# Patient Record
Sex: Female | Born: 1965
Health system: Southern US, Community
[De-identification: ages and names within clinical notes are randomized; demographics above are authoritative.]

## PROBLEM LIST (undated history)

## (undated) DIAGNOSIS — N83202 Unspecified ovarian cyst, left side: Secondary | ICD-10-CM

## (undated) DIAGNOSIS — I10 Essential (primary) hypertension: Secondary | ICD-10-CM

## (undated) DIAGNOSIS — D329 Benign neoplasm of meninges, unspecified: Secondary | ICD-10-CM

## (undated) DIAGNOSIS — I8290 Acute embolism and thrombosis of unspecified vein: Secondary | ICD-10-CM

## (undated) DIAGNOSIS — C801 Malignant (primary) neoplasm, unspecified: Secondary | ICD-10-CM

## (undated) DIAGNOSIS — G43909 Migraine, unspecified, not intractable, without status migrainosus: Secondary | ICD-10-CM

## (undated) DIAGNOSIS — N9089 Other specified noninflammatory disorders of vulva and perineum: Secondary | ICD-10-CM

## (undated) DIAGNOSIS — N6009 Solitary cyst of unspecified breast: Secondary | ICD-10-CM

## (undated) DIAGNOSIS — IMO0002 Reserved for concepts with insufficient information to code with codable children: Secondary | ICD-10-CM

## (undated) DIAGNOSIS — R87619 Unspecified abnormal cytological findings in specimens from cervix uteri: Secondary | ICD-10-CM

## (undated) HISTORY — DX: Benign neoplasm of meninges, unspecified: D32.9

## (undated) HISTORY — DX: Acute embolism and thrombosis of unspecified vein: I82.90

## (undated) HISTORY — DX: Malignant (primary) neoplasm, unspecified: C80.1

## (undated) HISTORY — DX: Migraine, unspecified, not intractable, without status migrainosus: G43.909

## (undated) HISTORY — DX: Unspecified abnormal cytological findings in specimens from cervix uteri: R87.619

## (undated) HISTORY — DX: Essential (primary) hypertension: I10

## (undated) HISTORY — DX: Reserved for concepts with insufficient information to code with codable children: IMO0002

## (undated) HISTORY — PX: BASAL CELL CARCINOMA EXCISION: SHX1214

## (undated) HISTORY — DX: Other specified noninflammatory disorders of vulva and perineum: N90.89

## (undated) HISTORY — PX: WISDOM TOOTH EXTRACTION: SHX21

## (undated) HISTORY — DX: Unspecified ovarian cyst, left side: N83.202

## (undated) HISTORY — DX: Solitary cyst of unspecified breast: N60.09

---

## 1991-11-14 HISTORY — PX: CRYOTHERAPY: SHX1416

## 2006-11-13 HISTORY — PX: OTHER SURGICAL HISTORY: SHX169

## 2007-05-01 ENCOUNTER — Other Ambulatory Visit: Admission: RE | Admit: 2007-05-01 | Discharge: 2007-05-01 | Payer: Self-pay | Admitting: Obstetrics & Gynecology

## 2007-05-29 ENCOUNTER — Encounter: Admission: RE | Admit: 2007-05-29 | Discharge: 2007-05-29 | Payer: Self-pay | Admitting: Obstetrics & Gynecology

## 2007-05-31 ENCOUNTER — Encounter: Admission: RE | Admit: 2007-05-31 | Discharge: 2007-05-31 | Payer: Self-pay | Admitting: Obstetrics & Gynecology

## 2007-07-15 HISTORY — PX: NOVASURE ABLATION: SHX5394

## 2007-07-23 ENCOUNTER — Ambulatory Visit (HOSPITAL_BASED_OUTPATIENT_CLINIC_OR_DEPARTMENT_OTHER): Admission: RE | Admit: 2007-07-23 | Discharge: 2007-07-23 | Payer: Self-pay | Admitting: Obstetrics & Gynecology

## 2008-05-13 HISTORY — PX: LIPOMA EXCISION: SHX5283

## 2008-05-20 ENCOUNTER — Other Ambulatory Visit: Admission: RE | Admit: 2008-05-20 | Discharge: 2008-05-20 | Payer: Self-pay | Admitting: Obstetrics & Gynecology

## 2008-06-30 ENCOUNTER — Encounter: Admission: RE | Admit: 2008-06-30 | Discharge: 2008-06-30 | Payer: Self-pay | Admitting: Obstetrics & Gynecology

## 2009-07-01 ENCOUNTER — Encounter: Admission: RE | Admit: 2009-07-01 | Discharge: 2009-07-01 | Payer: Self-pay | Admitting: Obstetrics & Gynecology

## 2010-07-07 ENCOUNTER — Encounter: Admission: RE | Admit: 2010-07-07 | Discharge: 2010-07-07 | Payer: Self-pay | Admitting: Obstetrics & Gynecology

## 2010-12-04 ENCOUNTER — Encounter: Payer: Self-pay | Admitting: Obstetrics & Gynecology

## 2011-03-28 NOTE — Op Note (Signed)
NAME:  Samantha Hoover, Samantha Hoover                ACCOUNT NO.:  [id number]   MEDICAL RECORD NO.:  [id number]          PATIENT TYPE:  AMB   LOCATION:  [facility]                         FACILITY:  [facility]   PHYSICIAN:  M. Leda Quail, MD  DATE OF BIRTH:  [birth year]   DATE OF PROCEDURE:  07/23/2007  DATE OF DISCHARGE:                               OPERATIVE REPORT   PREOPERATIVE DIAGNOSES:  45. A 45 year old G3, P3 married white female with menorrhagia.  2. Undesired fertility.  3. History of migraines.  4. History of superficial left vein clot due to varicosity  5. Mild hypertension.   POSTOPERATIVE DIAGNOSES:  46. A 45 year old G34, P3 married white female with menorrhagia.  2. Undesired fertility.  3. History of migraines.  4. History of superficial left vein clot due to varicosity  5. Mild hypertension.   PROCEDURE:  1. NovaSure ablation.  2. Hysteroscopy.  3. Laparoscopic bilateral tubal ligation with Filshie clips.   SURGEON:  M. Leda Quail, MD   ASSISTANT:  OR staff.   ANESTHESIA:  General endotracheal.   FINDINGS:  Intraperitoneally she had a normal-appearing uterus, tubes,  and ovaries.  She had a normal looking bladder, a normal liver edge.  The stomach edge could not be seen do to omentum.  The gallbladder could  not be seen; and the appendix was not visualized.  On hysteroscopy she  did have a normal cavity except that the right side of the uterus had a  bit of an arcuate pattern.   SPECIMENS:  None.   ESTIMATED BLOOD LOSS:  Minimal.   FLUIDS:  1200 mL of. LR.   URINE OUTPUT:  75 mL of clear urine drained with a red rubber Foley  catheter at the beginning of the procedure.   FLUID DEFICIT:  60 mL of the hysteroscopic fluid media.   DESCRIPTION OF PROCEDURE:  Patient taken to operation room.  She was  placed in the supine position.  Anesthesia was administered by the  anesthesia staff without of difficulty.  Legs were then positioned in  [place] stirrups in the low  lithotomy position.  Abdomen, perineum, inner  thighs, and vagina prepped and draped in a normal sterile fashion.  A  red rubber Foley catheter was used to drain the bladder of all urine.  A  speculum was placed in the vagina.  Cervix was well visualized; and the  anterior lip of the cervix was grasped with a single-tooth tenaculum.  An Acorn uterine manipulator was passed through the cervical os and  attached to the tenaculum as a means of manipulating the uterus during  the procedure.   Attention was turned to the abdomen, 7 mL of 1/4% Marcaine were  instilled beneath the umbilicus.  A 10-mm skin incision was made with a  #11 blade.  The subcutaneous fat tissue was dissected with a hemostat.  The sacral promontory was palpated.  The abdominal wall was elevated and  a Veress needle was inserted through the infraumbilical incision aiming  toward the pelvis below the level of the sacral promontory.  Once the  peritoneum was popped through;  a syringe of normal saline was obtained.  An aspiration was performed with no fluid or blood noted.  Fluid injects  easily; and then an aspiration was performed, again, without any blood  fluid or normal saline coming back in the [place].  Then a drip test  dripped easily into the abdomen.  CO2 gas was attached to the Veress  needle under low flow, and a pneumoperitoneum was achieved easily.  Pressures were never higher than 8 mmHg and 3 liters of gas was  instilled into the abdomen under low flow.   At this point, a Veress needle was removed.  A 10-mm nonbladed OptiVu  trocar and port were obtained.  The straight scope was passed through  this.  This was aimed towards the pelvis below the level of the sacral  promontory; and using a twisting motion the abdominal wall layers were  well visualized as they were traversed.  The trocar was removed and the  laparoscope was used to survey the pelvis.  The patient was then placed  in some Trendelenburg.  An  operative scope with a Filshie clip, and a  clip applier was obtained.  The tubes on each side are identified and  followed out to the fimbriated end.  Then a Filshie clip was placed on  the isthmic region of both tubes bilaterally; and without difficulty  photo documentation was made.   At this point the patient was placed out of Trendelenburg and the scope  was removed.  The pneumoperitoneum was easily relieved.  Anesthesiologist gave the patient several deep breaths while palpating  the abdomen to ensure that all the gas was out of the abdomen.  Then the  midline port was completely removed.  An operating finger was placed in  the port and no bowel was within the incision.  The fascia was closed  with a figure-of-eight suture of #2-0 Vicryl on a UR needle; and the  skin was closed with a subcuticular stitch of 3-0 Vicryl and Benzoin was  applied to the skin.   Attention was turned to the vagina.  Legs were positioned in the high  lithotomy position.  A bivalve speculum was placed in the vagina, and an  Acorn uterine manipulator was removed from the cervix.  The uterus was  then sounded to 9 cm.  The endocervical canal was measured at  approximately 5 cm with a cavity length of 4.5 cm.  The cervix was  dilated up using Hegar dilators to a #8.  The hysteroscope was passed  through the cervical canal and using a normal saline as hysteroscope  media, the cavity is dilated open with the fluid.  Tubal ostia are noted  bilaterally in the cavity is noted; and photo documentation was made.   At this point the hysteroscope was removed.  The NovaSure apparatus is  obtained.  This opened outside the patient to ensure that the array was  working correctly.  The cavity length is set.  The vacuum valve is  tapped; then the device was placed in the patient and opened.  The  device is seated by moving it up-and-down, twisting it to the right-and-  the-left, moving it back-and-forth, and this was  done twice.  The cavity  width was 2.9.  This was done a second time to ensure that there were no  issues of the array opening correctly.  We got the same cavity width;  and, therefore, I decided to proceed with the cavity assessment test.  The cervical cap was placed over the cervix; and the cavity assessment  test was performed which passed easily.  Then the ablation was  performed.  The power was at 72 and a 120-second cycle was performed.  After the cycle was complete, the array was closed, and the device was  brought outside of the patient.   Using a hysteroscope the cavity was visualized again.  There was  excellent ablation except for a small area, right at the area of the  cornu of the uterus on the right side; and this area actually appeared  to have an arcuate pattern to it unlike the opposite side.  There was a  very small area that did not get completely ablated.  At this point, the  procedure was ended.  All instruments were removed.  The tenaculum was  removed from the cervix.  Of note, a cervical block was placed as the  procedure started vaginally.  This block was done with 1% of lidocaine;  and 10 mL were instilled circumferentially on the cervix at the 3, 6, 9,  and 12 o'clock positions with 2.5 mL being instilled in each location.   Sponge, lap, needle, and instrument counts were correct x2.  The patient  tolerated the procedure very well; and she was awakened from anesthesia  and taken to the recovery room in stable condition.      Lum Keas, MD  Electronically Signed     MSM/MEDQ  D:  07/23/2007  T:  07/23/2007  Job:  [id number]

## 2011-06-06 ENCOUNTER — Other Ambulatory Visit: Payer: Self-pay | Admitting: Obstetrics & Gynecology

## 2011-06-06 DIAGNOSIS — Z1231 Encounter for screening mammogram for malignant neoplasm of breast: Secondary | ICD-10-CM

## 2011-07-11 ENCOUNTER — Ambulatory Visit
Admission: RE | Admit: 2011-07-11 | Discharge: 2011-07-11 | Disposition: A | Discharge status: Home / Self Care | Payer: BC Managed Care – PPO | Source: Ambulatory Visit | Attending: Obstetrics & Gynecology | Admitting: Obstetrics & Gynecology

## 2011-07-11 DIAGNOSIS — Z1231 Encounter for screening mammogram for malignant neoplasm of breast: Secondary | ICD-10-CM

## 2011-08-25 LAB — URINALYSIS, ROUTINE W REFLEX MICROSCOPIC
Glucose, UA: NEGATIVE
Hgb urine dipstick: NEGATIVE
Ketones, ur: NEGATIVE
Urobilinogen, UA: 0.2

## 2011-08-25 LAB — CBC
HCT: 36.7
Hemoglobin: 12.5
MCHC: 34
RDW: 13.9

## 2011-08-25 LAB — PREGNANCY, URINE: Preg Test, Ur: NEGATIVE

## 2012-08-02 ENCOUNTER — Other Ambulatory Visit (HOSPITAL_BASED_OUTPATIENT_CLINIC_OR_DEPARTMENT_OTHER): Payer: Self-pay | Admitting: *Deleted

## 2012-08-02 DIAGNOSIS — Z139 Encounter for screening, unspecified: Secondary | ICD-10-CM

## 2012-08-05 ENCOUNTER — Ambulatory Visit (HOSPITAL_BASED_OUTPATIENT_CLINIC_OR_DEPARTMENT_OTHER)
Admission: RE | Admit: 2012-08-05 | Discharge: 2012-08-05 | Disposition: A | Discharge status: Home / Self Care | Payer: Commercial Managed Care - PPO | Source: Ambulatory Visit | Attending: Obstetrics & Gynecology | Admitting: Obstetrics & Gynecology

## 2012-08-05 DIAGNOSIS — Z1231 Encounter for screening mammogram for malignant neoplasm of breast: Secondary | ICD-10-CM | POA: Insufficient documentation

## 2012-08-05 DIAGNOSIS — Z139 Encounter for screening, unspecified: Secondary | ICD-10-CM

## 2013-05-12 ENCOUNTER — Telehealth: Payer: Self-pay | Admitting: Obstetrics & Gynecology

## 2013-05-12 ENCOUNTER — Encounter: Payer: Self-pay | Admitting: Obstetrics & Gynecology

## 2013-05-12 NOTE — Telephone Encounter (Signed)
Patient has appointment on June 11, 2013 1:45. This is the earliest that I could get her in. She is having problems with ovarian pain in both sides. Mother has had Pre cancers cells in the past which end up as a hysterectomy.I am putting patient on waiting list . But, she would like to see if she can get in sooner.

## 2013-05-12 NOTE — Telephone Encounter (Signed)
Spoke with pt about appt tomorrow at 12:45. Pt able to come. resched OV for 05-13-13 at 12:45 with SM.

## 2013-05-13 ENCOUNTER — Encounter: Payer: Self-pay | Admitting: Obstetrics & Gynecology

## 2013-05-13 ENCOUNTER — Ambulatory Visit (INDEPENDENT_AMBULATORY_CARE_PROVIDER_SITE_OTHER): Payer: Commercial Managed Care - PPO | Admitting: Obstetrics & Gynecology

## 2013-05-13 ENCOUNTER — Other Ambulatory Visit: Payer: Self-pay | Admitting: *Deleted

## 2013-05-13 VITALS — BP 118/78 | HR 60 | Resp 16 | Ht 67.0 in | Wt 154.4 lb

## 2013-05-13 DIAGNOSIS — N9489 Other specified conditions associated with female genital organs and menstrual cycle: Secondary | ICD-10-CM

## 2013-05-13 NOTE — Patient Instructions (Signed)
Please call if anything changes before your follow-up appointment

## 2013-05-13 NOTE — Progress Notes (Signed)
Subjective:     Patient ID: Samantha Hoover, female   DOB: 1966/07/13, 47 y.o.   MRN: [id number]  HPI 47 year old here for bilateral pelvic pain/cramping that has been present almost constantly for the last several months.  She reports this feels just like "ovulation pain" but it's all the time.  No fevers.  Bowel movements are regular.  Very rarely does she have constipation.  She has no urinary issues.  Her mother had a hysterectomy around age 61.    Has lost 25 pounds this past year through weight watchers at work.  She has been stable stable for several weeks now.    Review of Systems  All other systems reviewed and are negative.       Objective:   Physical Exam  Constitutional: She is oriented to person, place, and time. She appears well-developed and well-nourished.  Neck: Normal range of motion. Neck supple.  Abdominal: Soft. Bowel sounds are normal. She exhibits no distension. There is no tenderness. There is no rebound and no guarding.  Genitourinary: There is no rash or tenderness on the right labia. There is no rash or tenderness on the left labia. Uterus is not deviated (retroflexed), not fixed and not tender. Cervix exhibits no motion tenderness, no discharge and no friability. Right adnexum displays no mass, no tenderness and no fullness. Left adnexum displays no mass, no tenderness and no fullness. No erythema, tenderness or bleeding around the vagina. No foreign body around the vagina. No signs of injury around the vagina.  Musculoskeletal: Normal range of motion.  Neurological: She is alert and oriented to person, place, and time.  Skin: Skin is warm and dry.  Psychiatric: She has a normal mood and affect.       Assessment:     Chronic pelvic pain/cramping, bilateral "ovarian pain" H/O novasure ablation     Plan:     PUS to assess ovaries If normal, pt contemplating LAVH/poss BSO.  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

## 2013-05-21 NOTE — Telephone Encounter (Signed)
LMTCB to discuss insurance information for upcoming PUS. Patient owes $313.30/CME

## 2013-06-02 ENCOUNTER — Other Ambulatory Visit: Payer: Self-pay | Admitting: Obstetrics & Gynecology

## 2013-06-02 ENCOUNTER — Ambulatory Visit (INDEPENDENT_AMBULATORY_CARE_PROVIDER_SITE_OTHER): Payer: Commercial Managed Care - PPO | Admitting: Obstetrics & Gynecology

## 2013-06-02 ENCOUNTER — Encounter: Payer: Self-pay | Admitting: Obstetrics & Gynecology

## 2013-06-02 ENCOUNTER — Ambulatory Visit (INDEPENDENT_AMBULATORY_CARE_PROVIDER_SITE_OTHER): Payer: Commercial Managed Care - PPO

## 2013-06-02 DIAGNOSIS — N9489 Other specified conditions associated with female genital organs and menstrual cycle: Secondary | ICD-10-CM

## 2013-06-02 DIAGNOSIS — Q519 Congenital malformation of uterus and cervix, unspecified: Secondary | ICD-10-CM

## 2013-06-02 DIAGNOSIS — N8300 Follicular cyst of ovary, unspecified side: Secondary | ICD-10-CM

## 2013-06-02 DIAGNOSIS — N949 Unspecified condition associated with female genital organs and menstrual cycle: Secondary | ICD-10-CM

## 2013-06-02 DIAGNOSIS — R102 Pelvic and perineal pain: Secondary | ICD-10-CM

## 2013-06-02 NOTE — Patient Instructions (Signed)
Plan repeat ultrasound three months.

## 2013-06-02 NOTE — Progress Notes (Signed)
47 y.o.Marriedfemale here for a pelvic ultrasound.  Patient has been experiencing pelvic cramping/pain for months.  This has been getting gradually worse.  She did undergo an ablation in 9/08.  The pain she feels is like menstrual cramping but it seems to be present all of the time.  She does report in the last three to four weeks that she is experiencing more LLQ pain as well.  She does cycle but the bleeding is still much lighter than before her ablation  Patient's last menstrual period was 05/19/2013.  Sexually active:  yes  Contraception: bilateral tubal ligation  FINDINGS: UTERUS: 9.5 x 5.4 x 4.4cm without fibroids, septum noted (images from 2007 reviewed at well as operative report.  Mild arcuate uterine cavity noted at time of hysteroscopy/ablation.) EMS: 5.70mm ADNEXA:   Left ovary 4.4 x 4.1 x 3.3cm with three smooth walled cysts <2cm   Right ovary 2.8 x 1.8 x 1.2cm CUL DE SAC: no free fluid  Discussed with patient findings.  Images reviewed with patient.  I feel she has post-ablation pain due to the nature of her pain and that the left ovarian cysts are functional.  I feel they will resolve with time and have recommended follow-up.  She is in agreement with this.  She is contemplating a hysterectomy if pain continues.  She cannot do this until the end of the year.  Timing discussed with patient.  She wants to check schedule.  Procedure, risks, recovery all discussed with patient.    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  Patient considering hysterectomy and will call later in the year or discuss at follow-up.  ~25 minutes spent with patient >50% of time was in face to face discussion of above.

## 2013-06-11 ENCOUNTER — Ambulatory Visit: Payer: Self-pay | Admitting: Obstetrics & Gynecology

## 2013-07-17 ENCOUNTER — Other Ambulatory Visit: Payer: Self-pay | Admitting: Obstetrics & Gynecology

## 2013-07-17 DIAGNOSIS — Z1231 Encounter for screening mammogram for malignant neoplasm of breast: Secondary | ICD-10-CM

## 2013-08-05 ENCOUNTER — Telehealth: Payer: Self-pay | Admitting: Obstetrics & Gynecology

## 2013-08-05 NOTE — Telephone Encounter (Signed)
LMTCB to reschedule PUS. Cx current appt.

## 2013-08-06 ENCOUNTER — Ambulatory Visit (HOSPITAL_BASED_OUTPATIENT_CLINIC_OR_DEPARTMENT_OTHER): Payer: BC Managed Care – PPO

## 2013-08-07 NOTE — Telephone Encounter (Signed)
Patient Called back during lunch to reschedule PUS

## 2013-08-11 ENCOUNTER — Ambulatory Visit (HOSPITAL_BASED_OUTPATIENT_CLINIC_OR_DEPARTMENT_OTHER)
Admission: RE | Admit: 2013-08-11 | Discharge: 2013-08-11 | Disposition: A | Discharge status: Home / Self Care | Payer: Commercial Managed Care - PPO | Source: Ambulatory Visit | Attending: Obstetrics & Gynecology | Admitting: Obstetrics & Gynecology

## 2013-08-11 DIAGNOSIS — Z1231 Encounter for screening mammogram for malignant neoplasm of breast: Secondary | ICD-10-CM | POA: Insufficient documentation

## 2013-08-11 NOTE — Telephone Encounter (Signed)
Pt calling back to schedule ultrasound.

## 2013-08-11 NOTE — Telephone Encounter (Signed)
LMTCB to reschedule PUS.

## 2013-09-02 ENCOUNTER — Other Ambulatory Visit: Payer: Commercial Managed Care - PPO | Admitting: Obstetrics & Gynecology

## 2013-09-02 ENCOUNTER — Other Ambulatory Visit: Payer: Commercial Managed Care - PPO

## 2013-09-04 ENCOUNTER — Ambulatory Visit (INDEPENDENT_AMBULATORY_CARE_PROVIDER_SITE_OTHER): Payer: Commercial Managed Care - PPO

## 2013-09-04 ENCOUNTER — Ambulatory Visit (INDEPENDENT_AMBULATORY_CARE_PROVIDER_SITE_OTHER): Payer: Commercial Managed Care - PPO | Admitting: Obstetrics & Gynecology

## 2013-09-04 VITALS — BP 102/64 | Ht 67.0 in | Wt 159.0 lb

## 2013-09-04 DIAGNOSIS — N8300 Follicular cyst of ovary, unspecified side: Secondary | ICD-10-CM

## 2013-09-04 DIAGNOSIS — N83209 Unspecified ovarian cyst, unspecified side: Secondary | ICD-10-CM

## 2013-09-04 NOTE — Progress Notes (Signed)
47 y.o.Marriedfemale here for a pelvic ultrasound.  H/O enlarged left ovary with multiple cysts found with PUS after patient was being evaluated for pelvic cramping.  Pain has resolved.  No new issues.  Bowel and bladder function is normal.  No LMP recorded. H/O ablation.  Sexually active:  yes  Contraception: bilateral tubal ligation  FINDINGS: UTERUS: 9.3 x 5.9 x 4.4cm with septum EMS: 4.4 - 8.6cm ADNEXA:   Left ovary 3.0 x 1.5 x 1.7 cm with 9mm follicle.  Prior cysts resolved.   Right ovary 3.8 x 2.4 x 2.0cm with 15mm avascular cyst. CUL DE SAC: 15 x 25mm free fluid collection  Images reviewed with patient.  Cyst resolution noted on left ovary.  Small septum discussed.  Pt reports father had 2 ureters on one side.  She has never told me this in the past.  Aware I will discuss with Dr. Sherron Monday to see if any testing or evaluation is needed.  All questions answered.    Assessment:  Left ovarian cysts, resolved Small uterine septum Father with duplicated ureter, unilateral  Plan: F/U prn or for AEX. May need urology consultation.  Will check with Dr. Ranae Plumber.  ~15 minutes spent with patient >50% of time was in face to face discussion of above.

## 2013-09-11 ENCOUNTER — Encounter: Payer: Self-pay | Admitting: Obstetrics & Gynecology

## 2013-09-11 NOTE — Patient Instructions (Signed)
Please call with any new problems/issues. 

## 2013-09-18 ENCOUNTER — Ambulatory Visit (INDEPENDENT_AMBULATORY_CARE_PROVIDER_SITE_OTHER): Payer: Commercial Managed Care - PPO | Admitting: Obstetrics & Gynecology

## 2013-09-18 ENCOUNTER — Encounter: Payer: Self-pay | Admitting: Obstetrics & Gynecology

## 2013-09-18 VITALS — BP 110/70 | HR 74 | Resp 16 | Wt 157.0 lb

## 2013-09-18 DIAGNOSIS — Z0000 Encounter for general adult medical examination without abnormal findings: Secondary | ICD-10-CM

## 2013-09-18 DIAGNOSIS — Z01419 Encounter for gynecological examination (general) (routine) without abnormal findings: Secondary | ICD-10-CM

## 2013-09-18 LAB — HEMOGLOBIN, FINGERSTICK: Hemoglobin, fingerstick: 13.7 g/dL (ref 12.0–16.0)

## 2013-09-18 LAB — POCT URINALYSIS DIPSTICK
Blood, UA: NEGATIVE
Protein, UA: NEGATIVE
Urobilinogen, UA: NEGATIVE

## 2013-09-18 MED ORDER — NAPROXEN 500 MG PO TABS: 500.0000 mg | ORAL_TABLET | Freq: Two times a day (BID) | ORAL | Status: DC

## 2013-09-18 MED ORDER — RIZATRIPTAN BENZOATE 10 MG PO TABS: 10.0000 mg | ORAL_TABLET | ORAL | Status: DC | PRN

## 2013-09-18 MED ORDER — CLOBETASOL PROPIONATE 0.05 % EX OINT: TOPICAL_OINTMENT | CUTANEOUS | Status: DC | PRN

## 2013-09-18 NOTE — Progress Notes (Signed)
47 y.o. G3P3 MarriedCaucasianF here for annual exam.  Reports having blood work through work.  Was normal.  This is a work benefit at her current job.  Cycles still regular.  Patient's last menstrual period was 09/11/2013.          Sexually active: yes  The current method of family planning is tubal ligation.    Exercising: yes   walking Smoker: No  Health Maintenance: Pap: 08/08/12 neg History of abnormal Pap:  Yes   1993 Colpo/BX  And Cryo was done MMG: 08/2013 normal Colonoscopy: never BMD:   2008 normal TDaP   05/31/10 Screening Labs: here 2012, Hb today: 13.7, Urine today: neg   reports that she has never smoked. She has never used smokeless tobacco. She reports that she drinks about 0.5 ounces of alcohol per week. She reports that she does not use illicit drugs.  Past Medical History  Diagnosis Date  . Blood clot in vein     superficial vein left leg clot secondary BCP  . Hypertension   . Migraines   . Abnormal Pap smear     remote h/o  . Fissure of vulva     fissure/tear along inferior left labia majora  . Cyst (solitary) of breast   . Ovarian cyst, left 05/2013    Past Surgical History  Procedure Laterality Date  . Cesarean section    . Wisdom tooth extraction    . Laser of veins  1/08     left leg  . Cryotherapy  1993    to cervix  . Novasure ablation  9/08    and BTL  . Lipoma excision  7/09    Current Outpatient Prescriptions  Medication Sig Dispense Refill  . clobetasol ointment (TEMOVATE) 0.05 % Apply topically as needed.      . Multiple Vitamins-Minerals (MULTIVITAMIN PO) Take by mouth daily.      . naproxen (NAPROSYN) 500 MG tablet Take 500 mg by mouth. 3 4 x daily as needed      . Rizatriptan Benzoate (MAXALT PO) Take 10 mg by mouth as needed.       Marland Kitchen MELATONIN PO Take by mouth. Occasionally       No current facility-administered medications for this visit.    Family History  Problem Relation Age of Onset  . Cancer Mother     thyroid  .  Hypertension Mother   . Osteoporosis Mother   . Cancer Father     bladder  . Heart attack Maternal Grandfather   . Heart attack Paternal Grandmother     ROS:  Pertinent items are noted in HPI.  Otherwise, a comprehensive ROS was negative.  Exam:   BP 110/70  Pulse 74  Resp 16  Wt 157 lb (71.215 kg)  LMP 09/11/2013  Wt: -14 lbs from 9/13  General appearance: alert, cooperative and appears stated age Head: Normocephalic, without obvious abnormality, atraumatic Neck: no adenopathy, supple, symmetrical, trachea midline and thyroid normal to inspection and palpation Lungs: clear to auscultation bilaterally Breasts: normal appearance, no masses or tenderness Heart: regular rate and rhythm Abdomen: soft, non-tender; bowel sounds normal; no masses,  no organomegaly Extremities: extremities normal, atraumatic, no cyanosis or edema Skin: Skin color, texture, turgor normal. No rashes or lesions Lymph nodes: Cervical, supraclavicular, and axillary nodes normal. No abnormal inguinal nodes palpated Neurologic: Grossly normal   Pelvic: External genitalia:  no lesions              Urethra:  normal  appearing urethra with no masses, tenderness or lesions              Bartholins and Skenes: normal                 Vagina: normal appearing vagina with normal color and discharge, no lesions              Cervix: no lesions              Pap taken: no Bimanual Exam:  Uterus:  normal size, contour, position, consistency, mobility, non-tender              Adnexa: normal adnexa and no mass, fullness, tenderness               Rectovaginal: Confirms               Anus:  normal sphincter tone, no lesions  A:  Well Woman with normal exam H/O novasure ablation and BTL 9/08 Dysmenorrhea since ablation H/O superficial vein occlusion on COPs Migraines 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  Spoke with Dr. Sherron Monday about whether extra imaging needed.  Pt knows unless having problems/gyn  or gu surgery, no additional testing needed.  If ever needs testing, CT with contrast is recommended.  Pt clearly aware.  P:   Mammogram scheduled pap smear with neg HR HPV 9/13.  No Pap today. Clobetasol 0.05% ointment BID prn hemorrhoid pain.  #30 gm/RF to pharmacy Maxalt 10mg  at headache onset.  Repet 2 hours.  #9/5 RF to pharmacy. Naprosyn 500mg  BID prn.  #60 with RF to pharmacy. Pt will bring labs from work next year. return annually or prn  An After Visit Summary was printed and given to the patient.

## 2014-07-06 ENCOUNTER — Other Ambulatory Visit: Payer: Self-pay | Admitting: Obstetrics & Gynecology

## 2014-07-06 DIAGNOSIS — Z1231 Encounter for screening mammogram for malignant neoplasm of breast: Secondary | ICD-10-CM

## 2014-08-13 HISTORY — PX: MOLE REMOVAL: SHX2046

## 2014-08-17 ENCOUNTER — Ambulatory Visit (HOSPITAL_BASED_OUTPATIENT_CLINIC_OR_DEPARTMENT_OTHER)
Admission: RE | Admit: 2014-08-17 | Discharge: 2014-08-17 | Disposition: A | Discharge status: Home / Self Care | Payer: BC Managed Care – PPO | Source: Ambulatory Visit | Attending: Obstetrics & Gynecology | Admitting: Obstetrics & Gynecology

## 2014-08-17 DIAGNOSIS — Z1231 Encounter for screening mammogram for malignant neoplasm of breast: Secondary | ICD-10-CM

## 2014-09-14 ENCOUNTER — Encounter: Payer: Self-pay | Admitting: Obstetrics & Gynecology

## 2014-10-22 ENCOUNTER — Encounter: Payer: Self-pay | Admitting: Obstetrics & Gynecology

## 2014-10-22 ENCOUNTER — Ambulatory Visit (INDEPENDENT_AMBULATORY_CARE_PROVIDER_SITE_OTHER): Payer: BLUE CROSS/BLUE SHIELD | Admitting: Obstetrics & Gynecology

## 2014-10-22 ENCOUNTER — Telehealth: Payer: Self-pay

## 2014-10-22 VITALS — BP 120/80 | HR 80 | Resp 16 | Ht 66.75 in | Wt 167.0 lb

## 2014-10-22 DIAGNOSIS — N9089 Other specified noninflammatory disorders of vulva and perineum: Secondary | ICD-10-CM | POA: Diagnosis not present

## 2014-10-22 DIAGNOSIS — Z01419 Encounter for gynecological examination (general) (routine) without abnormal findings: Secondary | ICD-10-CM

## 2014-10-22 DIAGNOSIS — Z0000 Encounter for general adult medical examination without abnormal findings: Secondary | ICD-10-CM

## 2014-10-22 DIAGNOSIS — L292 Pruritus vulvae: Secondary | ICD-10-CM | POA: Diagnosis not present

## 2014-10-22 DIAGNOSIS — Z124 Encounter for screening for malignant neoplasm of cervix: Secondary | ICD-10-CM | POA: Diagnosis not present

## 2014-10-22 LAB — POCT URINALYSIS DIPSTICK
Bilirubin, UA: NEGATIVE
Glucose, UA: NEGATIVE
KETONES UA: NEGATIVE
LEUKOCYTES UA: NEGATIVE
Nitrite, UA: NEGATIVE
Protein, UA: NEGATIVE
RBC UA: NEGATIVE
UROBILINOGEN UA: NEGATIVE
pH, UA: 7

## 2014-10-22 MED ORDER — RIZATRIPTAN BENZOATE 10 MG PO TABS: 10.0000 mg | ORAL_TABLET | ORAL | Status: DC | PRN

## 2014-10-22 MED ORDER — NAPROXEN 500 MG PO TABS: 500.0000 mg | ORAL_TABLET | Freq: Two times a day (BID) | ORAL | Status: DC

## 2014-10-22 MED ORDER — CLOBETASOL PROPIONATE 0.05 % EX OINT: TOPICAL_OINTMENT | CUTANEOUS | Status: DC | PRN
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
## 2014-10-22 NOTE — Telephone Encounter (Signed)
Received fax from [facility] asking the frequency of how the patient will be using the rx and days supply limitation. (Naproxen). Per Dr. Sabra Heck, the directions is Take 1 tablet by mouth twice daily with a meal. Called pharmacy and stated the directions.  Encounter closed

## 2014-10-22 NOTE — Patient Instructions (Signed)

## 2014-10-24 LAB — IPS PAP TEST WITH REFLEX TO HPV

## 2014-11-03 ENCOUNTER — Telehealth: Payer: Self-pay

## 2014-11-03 NOTE — Telephone Encounter (Signed)
-----   Message from Lyman Speller, MD sent at 11/02/2014  1:08 PM EST ----- Inform pt biopsy showed just a chronic skin irritation.  Ok to keep using the steroid as needed.  Does she need RX?

## 2014-11-03 NOTE — Telephone Encounter (Signed)
Patient notified of results. Does not need rx at this time, will call back PRN.//kn

## 2014-11-03 NOTE — Telephone Encounter (Signed)
Lmtcb//kn 

## 2014-11-12 ENCOUNTER — Other Ambulatory Visit: Payer: BC Managed Care – PPO

## 2014-11-19 ENCOUNTER — Other Ambulatory Visit (INDEPENDENT_AMBULATORY_CARE_PROVIDER_SITE_OTHER): Payer: BLUE CROSS/BLUE SHIELD

## 2014-11-19 DIAGNOSIS — Z0000 Encounter for general adult medical examination without abnormal findings: Secondary | ICD-10-CM

## 2014-11-19 LAB — COMPREHENSIVE METABOLIC PANEL
ALT: 12 U/L (ref 0–35)
AST: 17 U/L (ref 0–37)
Albumin: 4.6 g/dL (ref 3.5–5.2)
Alkaline Phosphatase: 52 U/L (ref 39–117)
BUN: 10 mg/dL (ref 6–23)
CALCIUM: 9.6 mg/dL (ref 8.4–10.5)
CO2: 28 mEq/L (ref 19–32)
CREATININE: 0.84 mg/dL (ref 0.50–1.10)
Chloride: 98 mEq/L (ref 96–112)
Glucose, Bld: 95 mg/dL (ref 70–99)
Potassium: 4.2 mEq/L (ref 3.5–5.3)
SODIUM: 136 meq/L (ref 135–145)
Total Bilirubin: 0.9 mg/dL (ref 0.2–1.2)
Total Protein: 7.1 g/dL (ref 6.0–8.3)

## 2014-11-19 LAB — LIPID PANEL
CHOLESTEROL: 209 mg/dL — AB (ref 0–200)
HDL: 79 mg/dL (ref >39)
LDL Cholesterol: 116 mg/dL — ABNORMAL HIGH (ref 0–99)
Total CHOL/HDL Ratio: 2.6 Ratio
Triglycerides: 68 mg/dL (ref <150)
VLDL: 14 mg/dL (ref 0–40)

## 2014-11-19 LAB — CBC
HCT: 41.5 % (ref 36.0–46.0)
HEMOGLOBIN: 14.1 g/dL (ref 12.0–15.0)
MCH: 30.1 pg (ref 26.0–34.0)
MCHC: 34 g/dL (ref 30.0–36.0)
MCV: 88.5 fL (ref 78.0–100.0)
MPV: 9.1 fL (ref 8.6–12.4)
Platelets: 281 10*3/uL (ref 150–400)
RBC: 4.69 MIL/uL (ref 3.87–5.11)
RDW: 13.9 % (ref 11.5–15.5)
WBC: 6.5 10*3/uL (ref 4.0–10.5)

## 2014-11-19 LAB — TSH: TSH: 1.926 u[IU]/mL (ref 0.350–4.500)

## 2014-11-20 LAB — VITAMIN D 25 HYDROXY (VIT D DEFICIENCY, FRACTURES): Vit D, 25-Hydroxy: 29 ng/mL — ABNORMAL LOW (ref 30–100)

## 2014-11-23 ENCOUNTER — Other Ambulatory Visit: Payer: Self-pay

## 2014-11-23 NOTE — Telephone Encounter (Signed)
Lmtcb//kn 

## 2014-11-23 NOTE — Telephone Encounter (Signed)
Patient notified of all results. Aware to start Rx Vitamin D 50,000IU weekly, then OTC 2,000IU daily. Please sign Rx-pending.//kn

## 2014-11-23 NOTE — Telephone Encounter (Signed)
-----   Message from Lyman Speller, MD sent at 11/20/2014  9:54 AM EST ----- Please inform pt TSH and CMP are normal.  Lipids are mildly elevated at 209 and LDLs are 116 but HDLs are good, triglycerides are good, ratio is good.  This just needs to be watched and repeated every 1-2 years.  Also, Vit D is low at 29.  Need Vit D 50K weekly for 12 weeks and then 2000 IU daily.  Recheck at AEX next year.

## 2014-11-25 MED ORDER — VITAMIN D (ERGOCALCIFEROL) 1.25 MG (50000 UNIT) PO CAPS: 50000.0000 [IU] | ORAL_CAPSULE | ORAL | Status: DC

## 2015-02-02 DIAGNOSIS — Z86018 Personal history of other benign neoplasm: Secondary | ICD-10-CM | POA: Insufficient documentation

## 2015-02-02 DIAGNOSIS — D320 Benign neoplasm of cerebral meninges: Secondary | ICD-10-CM | POA: Insufficient documentation

## 2015-02-02 DIAGNOSIS — Z9889 Other specified postprocedural states: Secondary | ICD-10-CM

## 2015-02-02 HISTORY — DX: Other specified postprocedural states: Z98.890

## 2015-02-02 HISTORY — DX: Benign neoplasm of cerebral meninges: D32.0

## 2015-02-02 HISTORY — DX: Other specified postprocedural states: Z86.018

## 2015-03-31 HISTORY — PX: OTHER SURGICAL HISTORY: SHX169

## 2015-04-11 IMAGING — MG MM DIGITAL SCREENING BILAT W/ CAD
4 series · 4 of 4 positions shown · non-contrast
Comparison: Previous exam(s).

CLINICAL DATA: Screening.

DIGITAL SCREENING BILATERAL MAMMOGRAM WITH CAD

[R CC]
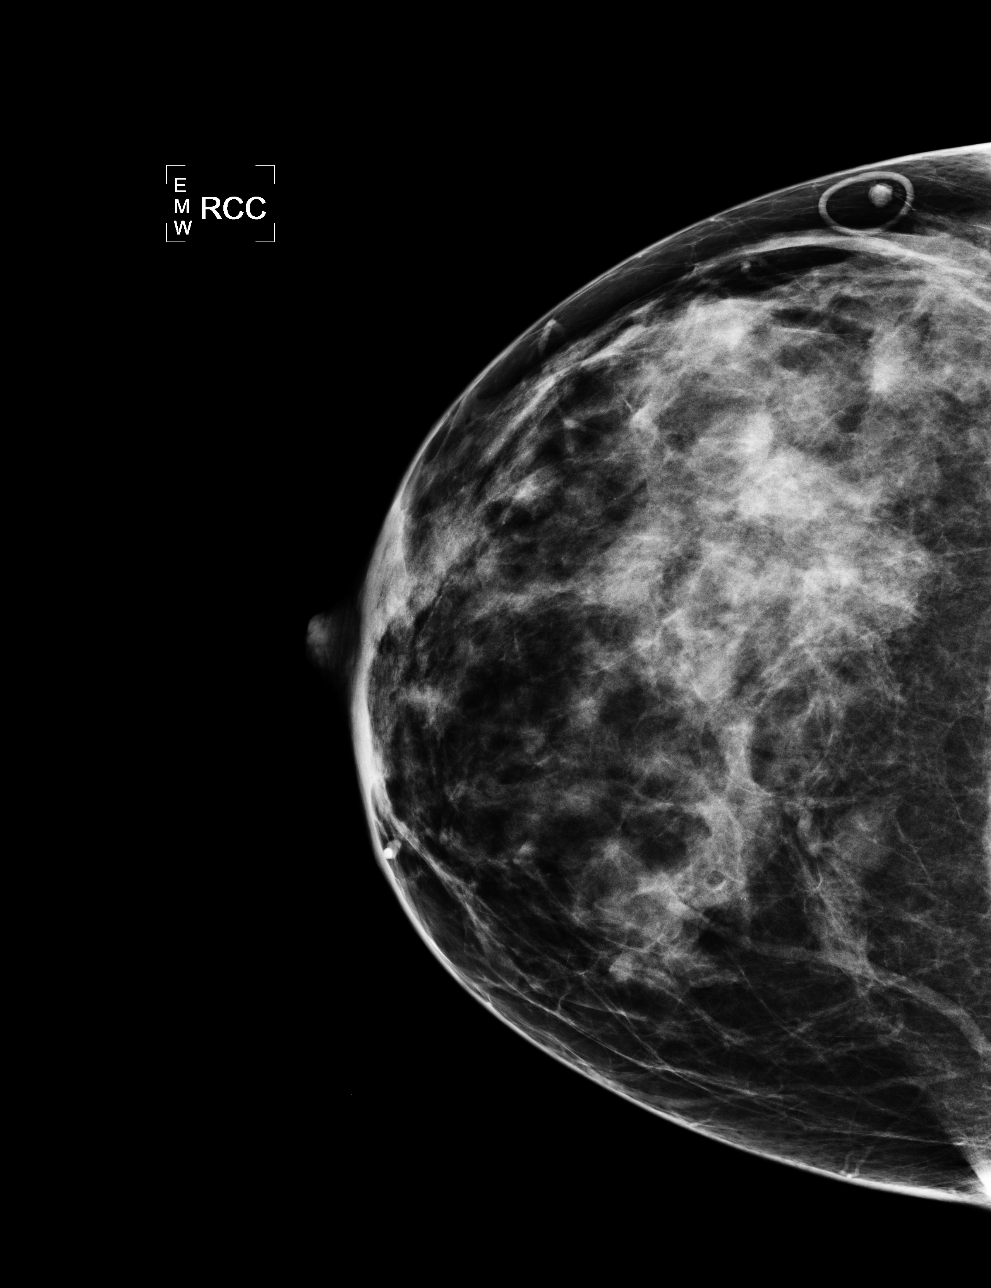

[L CC]
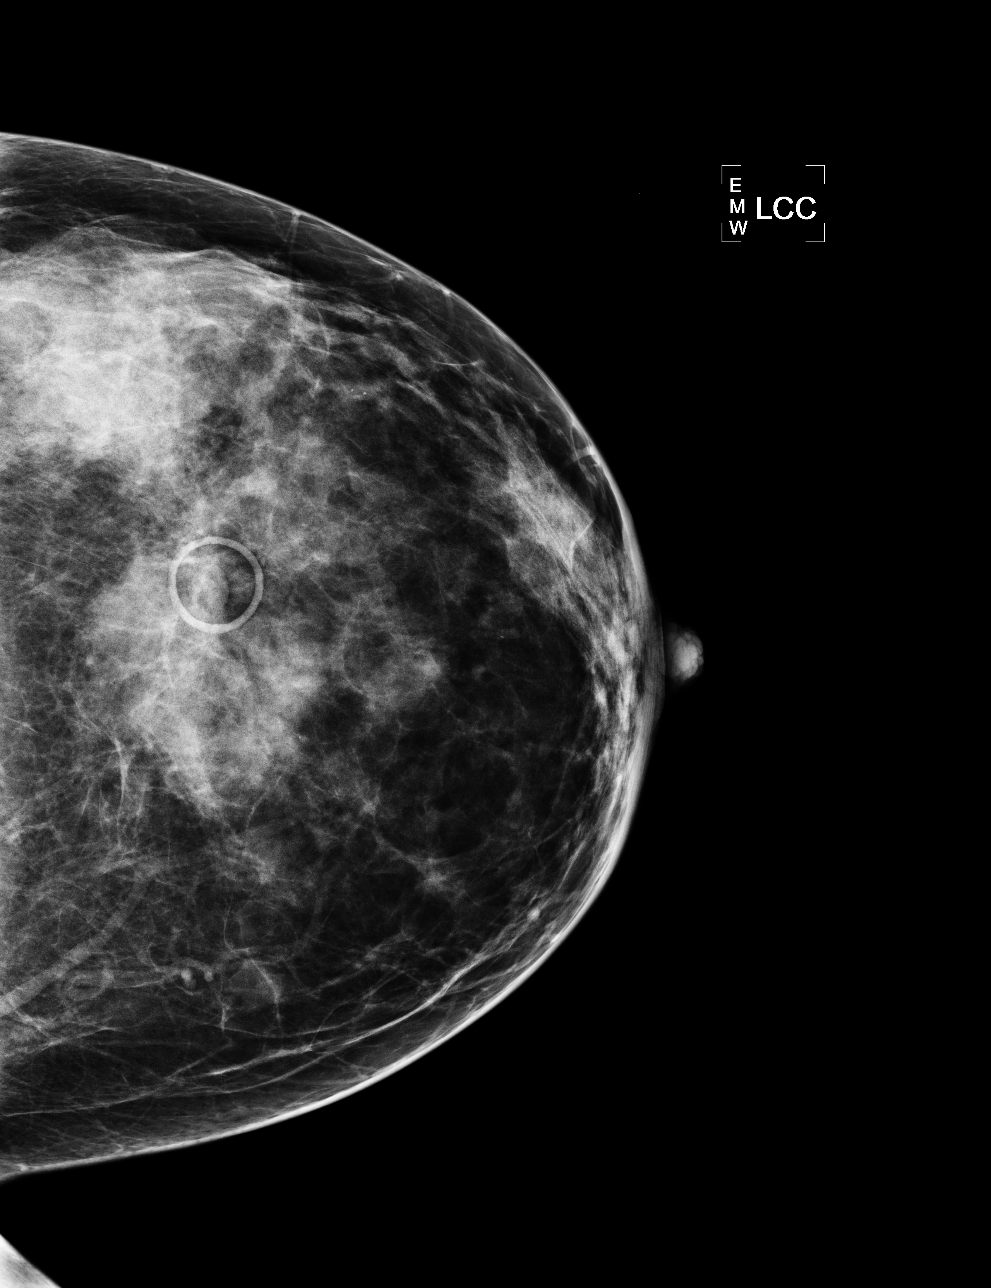

[L MLO]
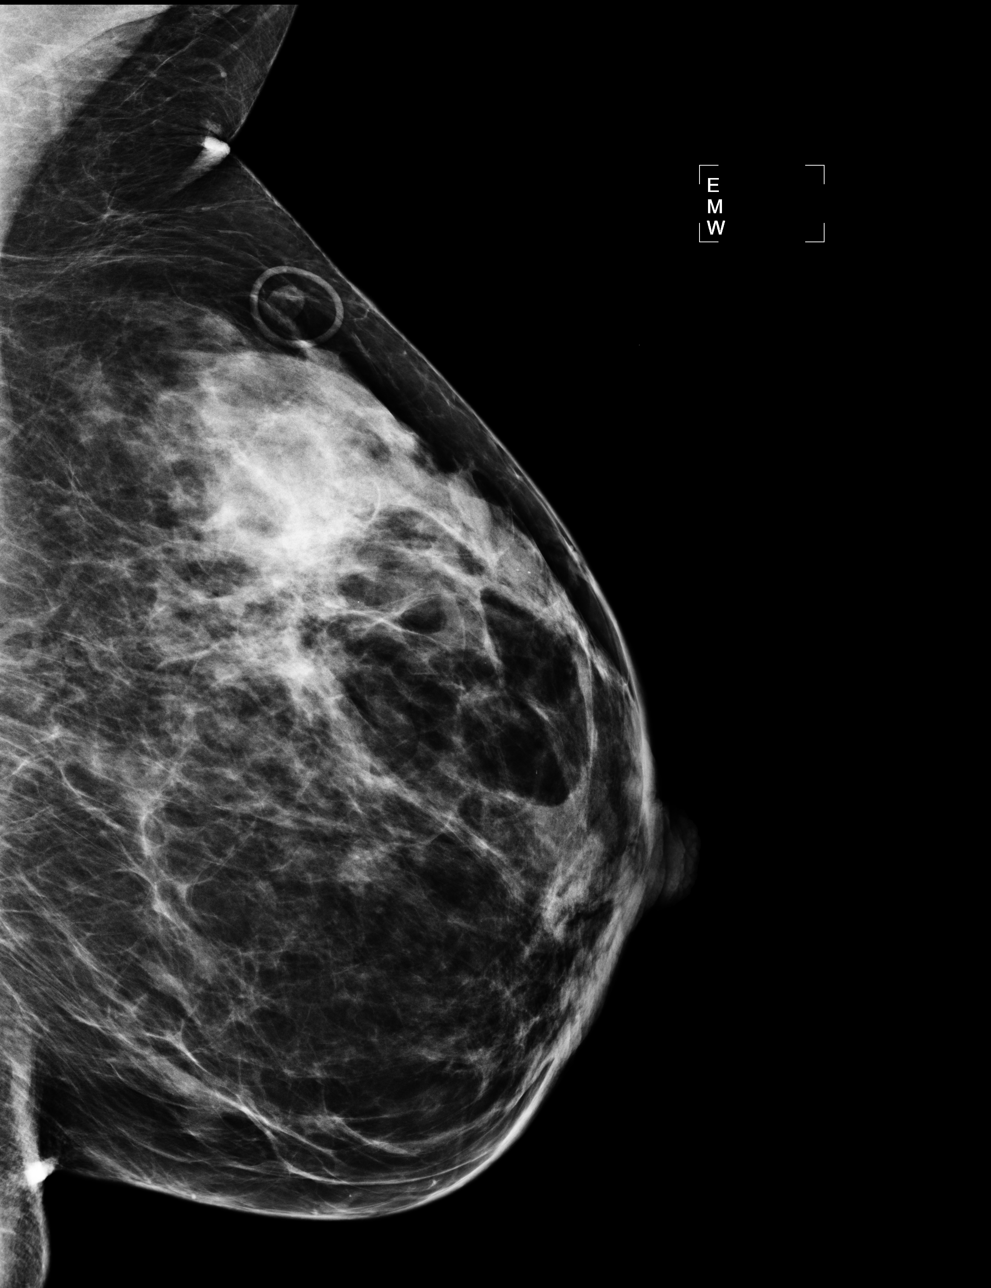

[R MLO]
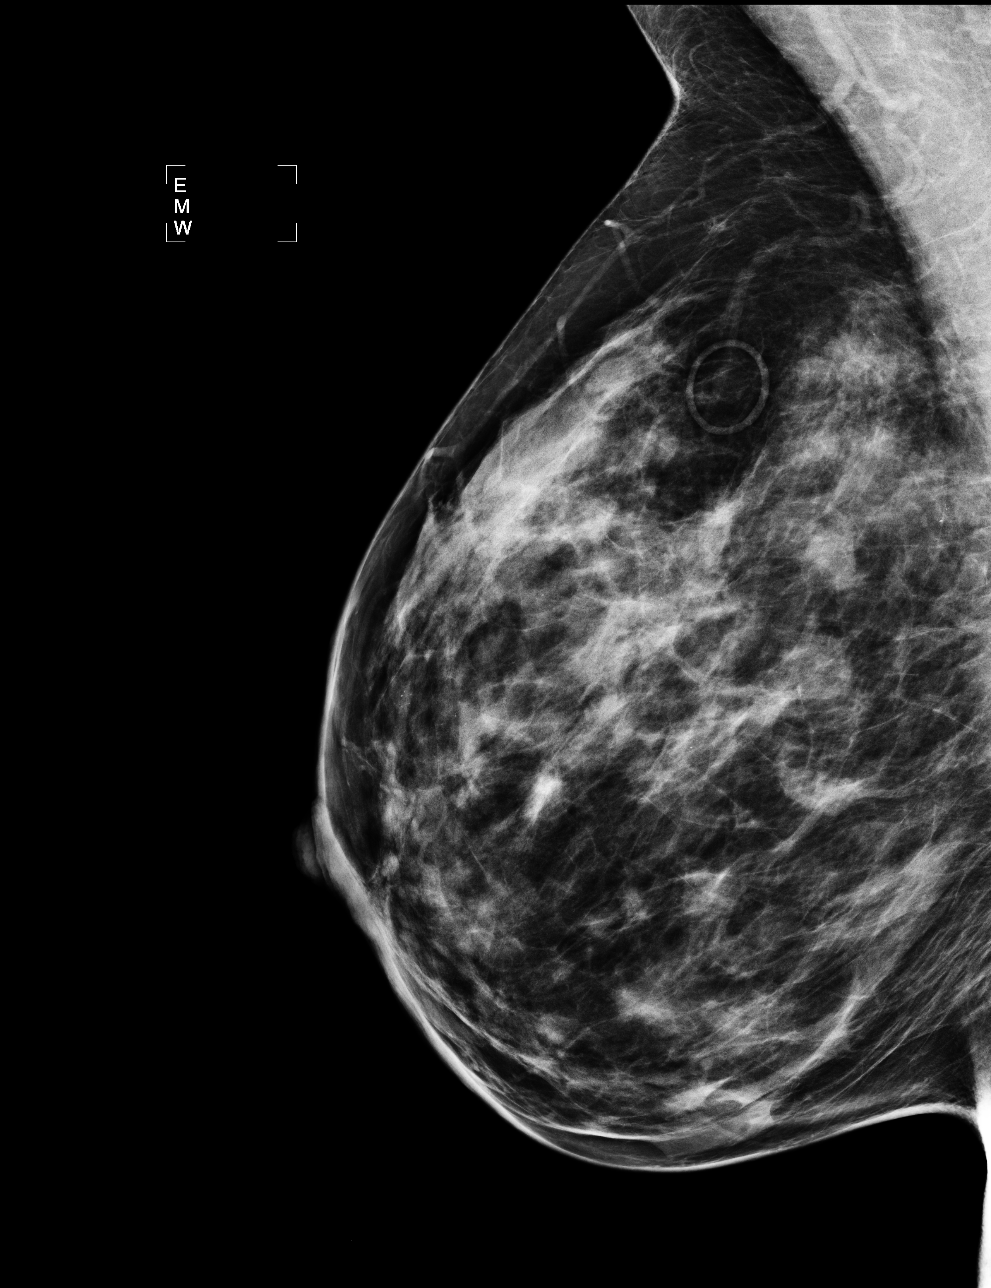

[4 of 4 positions shown; findings below may reference images not displayed]

FINDINGS: ACR Breast Density Category c:  The breast tissue is
heterogeneously dense, which may obscure small masses.

There are no findings suspicious for malignancy.

Images were processed with CAD.
IMPRESSION: No mammographic evidence of malignancy.

A result letter of this screening mammogram will be mailed directly
to the patient.

RECOMMENDATION:
Screening mammogram in one year. (Code:[id number])

BI-RADS CATEGORY 1:  Negative.

## 2015-07-23 ENCOUNTER — Other Ambulatory Visit: Payer: Self-pay

## 2015-07-23 DIAGNOSIS — Z1231 Encounter for screening mammogram for malignant neoplasm of breast: Secondary | ICD-10-CM

## 2015-08-26 ENCOUNTER — Ambulatory Visit: Payer: Commercial Managed Care - PPO

## 2015-09-07 ENCOUNTER — Telehealth: Payer: Self-pay | Admitting: Obstetrics & Gynecology

## 2015-09-07 NOTE — Telephone Encounter (Signed)
Left message to call  at [phone]. 

## 2015-09-07 NOTE — Telephone Encounter (Signed)
Order written and to Dr.Miller's desk for review and signature prior to faxing.

## 2015-09-07 NOTE — Telephone Encounter (Signed)
Patient normally gets her mammograms done at [facility] but patient works at [employer] [employer] and would like to get her mammograms done there. Patient is requesting order faxed to them at [phone]. Patient also needing to know if she needs to fil out release for [facility] to get her mammogram from the breast center  Best contact # for patient [phone]

## 2015-09-07 NOTE — Telephone Encounter (Signed)
Order signed and returned.  Ok to close encounter.

## 2015-09-10 NOTE — Telephone Encounter (Signed)
Left message to call [facility] at [phone].  Need to verify where she would like this order to be sent. The [facility]'s fax is [phone]. This does not match the fax provided by the patient.

## 2015-09-10 NOTE — Telephone Encounter (Signed)
Verified fax number with the [place] [facility] as [phone]. Order for screening mammogram faxed with cover sheet and confirmation. Left a detailed message for patient at [phone], okay per ROI. Advised fax number was verified and the order was faxed. Advised she will need to sign a release with the Breast Center to have her previous mammograms sent to the new imaging location. Advised to return call with any additional questions.  Routing to provider for final review. Patient agreeable to disposition. Will close encounter.

## 2015-09-10 NOTE — Telephone Encounter (Addendum)
Patient is in a conference and cannot get the correct number. She is using [facility]. She ask if you can call them to get it.

## 2015-09-25 ENCOUNTER — Other Ambulatory Visit: Payer: Self-pay | Admitting: Obstetrics & Gynecology

## 2015-09-27 NOTE — Telephone Encounter (Signed)
Medication refill request: Naprosyn 500MG  Last AEX:  10-22-14 Next AEX: 12-24-15 Last MMG (if hormonal medication request): 08-17-14 WNL Refill authorized: please advise

## 2015-12-24 ENCOUNTER — Encounter: Payer: Self-pay | Admitting: Obstetrics & Gynecology

## 2015-12-24 ENCOUNTER — Ambulatory Visit (INDEPENDENT_AMBULATORY_CARE_PROVIDER_SITE_OTHER): Payer: Commercial Managed Care - HMO | Admitting: Obstetrics & Gynecology

## 2015-12-24 VITALS — BP 120/70 | HR 74 | Resp 16 | Ht 66.75 in | Wt 165.0 lb

## 2015-12-24 DIAGNOSIS — Z0000 Encounter for general adult medical examination without abnormal findings: Secondary | ICD-10-CM | POA: Diagnosis not present

## 2015-12-24 DIAGNOSIS — Z124 Encounter for screening for malignant neoplasm of cervix: Secondary | ICD-10-CM | POA: Diagnosis not present

## 2015-12-24 DIAGNOSIS — Z01419 Encounter for gynecological examination (general) (routine) without abnormal findings: Secondary | ICD-10-CM | POA: Diagnosis not present

## 2015-12-24 LAB — POCT URINALYSIS DIPSTICK
Bilirubin, UA: NEGATIVE
Blood, UA: NEGATIVE
Glucose, UA: NEGATIVE
Ketones, UA: NEGATIVE
Leukocytes, UA: NEGATIVE
Nitrite, UA: NEGATIVE
PROTEIN UA: NEGATIVE
UROBILINOGEN UA: NEGATIVE
pH, UA: 5

## 2015-12-24 LAB — LIPID PANEL
CHOL/HDL RATIO: 3.1 ratio (ref <=5.0)
CHOLESTEROL: 224 mg/dL — AB (ref 125–200)
HDL: 73 mg/dL (ref >=46)
LDL Cholesterol: 136 mg/dL — ABNORMAL HIGH (ref <130)
Triglycerides: 73 mg/dL (ref <150)
VLDL: 15 mg/dL (ref <30)

## 2015-12-24 LAB — TSH: TSH: 1.62 m[IU]/L

## 2015-12-24 LAB — HEMOGLOBIN, FINGERSTICK: Hemoglobin, fingerstick: 13.8 g/dL (ref 12.0–16.0)

## 2015-12-24 LAB — VITAMIN D 25 HYDROXY (VIT D DEFICIENCY, FRACTURES): VIT D 25 HYDROXY: 30 ng/mL (ref 30–100)

## 2015-12-24 MED ORDER — NAPROXEN 500 MG PO TABS: 500.0000 mg | ORAL_TABLET | ORAL | Status: DC | PRN

## 2015-12-24 MED ORDER — RIZATRIPTAN BENZOATE 10 MG PO TABS: 10.0000 mg | ORAL_TABLET | ORAL | Status: DC | PRN

## 2015-12-24 NOTE — Addendum Note (Signed)
Addended by: Megan Salon on: 12/24/2015 09:40 AM   Modules accepted: Miquel Dunn
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
## 2015-12-28 LAB — IPS PAP TEST WITH HPV

## 2016-01-01 NOTE — Progress Notes (Signed)
Quick Note:  02 recall. Inform pt Pap and HR HPV were negative. Also inform Vit D was 30. This is fine. FSH was normal. Cholestesterol is mildly elevated at 224 and LDLs are 136. HDLs are good and triglycerides are normal. Nothing is needed for treatment. Just repeat one year. Will keep watching this. ______

## 2016-01-03 ENCOUNTER — Telehealth: Payer: Self-pay | Admitting: *Deleted

## 2016-01-03 NOTE — Telephone Encounter (Signed)
Pt notified.  Verbalized understanding.

## 2016-01-03 NOTE — Telephone Encounter (Signed)
Notes Recorded by Elroy Channel, CMA on 01/03/2016 at 10:45 AM LM for pt to call back. Notes Recorded by Megan Salon, MD on 01/01/2016 at 6:52 AM 02 recall. Inform pt Pap and HR HPV were negative. Also inform Vit D was 30. This is fine. FSH was normal. Cholestesterol is mildly elevated at 224 and LDLs are 136. HDLs are good and triglycerides are normal. Nothing is needed for treatment. Just repeat one year. Will keep watching this.

## 2016-02-23 DIAGNOSIS — Z85828 Personal history of other malignant neoplasm of skin: Secondary | ICD-10-CM | POA: Insufficient documentation

## 2016-02-23 DIAGNOSIS — E041 Nontoxic single thyroid nodule: Secondary | ICD-10-CM

## 2016-02-23 HISTORY — DX: Nontoxic single thyroid nodule: E04.1

## 2016-02-23 HISTORY — DX: Personal history of other malignant neoplasm of skin: Z85.828

## 2016-02-29 DIAGNOSIS — M47812 Spondylosis without myelopathy or radiculopathy, cervical region: Secondary | ICD-10-CM | POA: Insufficient documentation

## 2016-02-29 HISTORY — DX: Spondylosis without myelopathy or radiculopathy, cervical region: M47.812

## 2016-04-03 ENCOUNTER — Encounter: Payer: Self-pay | Admitting: Gastroenterology

## 2016-04-03 HISTORY — PX: COLONOSCOPY: SHX174

## 2016-06-22 ENCOUNTER — Other Ambulatory Visit: Payer: Self-pay | Admitting: Obstetrics & Gynecology

## 2016-06-22 DIAGNOSIS — Z1231 Encounter for screening mammogram for malignant neoplasm of breast: Secondary | ICD-10-CM

## 2016-09-18 DIAGNOSIS — I8390 Asymptomatic varicose veins of unspecified lower extremity: Secondary | ICD-10-CM | POA: Insufficient documentation

## 2016-09-18 HISTORY — DX: Asymptomatic varicose veins of unspecified lower extremity: I83.90

## 2016-12-21 ENCOUNTER — Encounter: Payer: Self-pay | Admitting: Obstetrics & Gynecology

## 2016-12-21 DIAGNOSIS — Z1231 Encounter for screening mammogram for malignant neoplasm of breast: Secondary | ICD-10-CM
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
## 2017-01-30 ENCOUNTER — Ambulatory Visit (INDEPENDENT_AMBULATORY_CARE_PROVIDER_SITE_OTHER): Payer: Commercial Managed Care - HMO | Admitting: Obstetrics & Gynecology

## 2017-01-30 ENCOUNTER — Encounter: Payer: Self-pay | Admitting: Obstetrics & Gynecology

## 2017-01-30 VITALS — BP 118/72 | HR 70 | Resp 14 | Ht 67.0 in | Wt 167.0 lb

## 2017-01-30 DIAGNOSIS — Z0000 Encounter for general adult medical examination without abnormal findings: Secondary | ICD-10-CM | POA: Diagnosis not present

## 2017-01-30 DIAGNOSIS — Z01419 Encounter for gynecological examination (general) (routine) without abnormal findings: Secondary | ICD-10-CM | POA: Diagnosis not present

## 2017-01-30 LAB — CBC
HCT: 41.9 % (ref 35.0–45.0)
Hemoglobin: 13.6 g/dL (ref 11.7–15.5)
MCH: 29.6 pg (ref 27.0–33.0)
MCHC: 32.5 g/dL (ref 32.0–36.0)
MCV: 91.3 fL (ref 80.0–100.0)
MPV: 9.3 fL (ref 7.5–12.5)
PLATELETS: 272 10*3/uL (ref 140–400)
RBC: 4.59 MIL/uL (ref 3.80–5.10)
RDW: 13.4 % (ref 11.0–15.0)
WBC: 5.6 10*3/uL (ref 3.8–10.8)

## 2017-01-30 LAB — LIPID PANEL
CHOL/HDL RATIO: 2.9 ratio (ref <5.0)
CHOLESTEROL: 186 mg/dL (ref <200)
HDL: 65 mg/dL (ref >50)
LDL Cholesterol: 101 mg/dL — ABNORMAL HIGH (ref <100)
Triglycerides: 100 mg/dL (ref <150)
VLDL: 20 mg/dL (ref <30)

## 2017-01-30 LAB — COMPREHENSIVE METABOLIC PANEL
ALK PHOS: 57 U/L (ref 33–130)
ALT: 10 U/L (ref 6–29)
AST: 13 U/L (ref 10–35)
Albumin: 4.3 g/dL (ref 3.6–5.1)
BILIRUBIN TOTAL: 0.6 mg/dL (ref 0.2–1.2)
BUN: 11 mg/dL (ref 7–25)
CO2: 29 mmol/L (ref 20–31)
Calcium: 9.3 mg/dL (ref 8.6–10.4)
Chloride: 103 mmol/L (ref 98–110)
Creat: 0.86 mg/dL (ref 0.50–1.05)
GLUCOSE: 96 mg/dL (ref 65–99)
POTASSIUM: 4 mmol/L (ref 3.5–5.3)
Sodium: 138 mmol/L (ref 135–146)
Total Protein: 6.6 g/dL (ref 6.1–8.1)

## 2017-01-30 MED ORDER — NAPROXEN 500 MG PO TABS: 500.0000 mg | ORAL_TABLET | ORAL | 3 refills | Status: DC | PRN

## 2017-01-30 MED ORDER — RIZATRIPTAN BENZOATE 10 MG PO TABS: 10.0000 mg | ORAL_TABLET | ORAL | 3 refills | Status: DC | PRN

## 2017-01-30 NOTE — Patient Instructions (Signed)
[facility], [address] [place], [place] [address]  Phone: [phone]

## 2017-01-31 LAB — VITAMIN D 25 HYDROXY (VIT D DEFICIENCY, FRACTURES): VIT D 25 HYDROXY: 28 ng/mL — AB (ref 30–100)

## 2017-04-19 ENCOUNTER — Ambulatory Visit: Payer: Commercial Managed Care - HMO | Admitting: Obstetrics & Gynecology

## 2017-07-02 ENCOUNTER — Telehealth: Payer: Self-pay | Admitting: *Deleted

## 2017-07-02 NOTE — Telephone Encounter (Signed)
Detailed message left per DPR on mobile number: [phone]. RN advised patient to return call for update to see if she had already had 6 month breast imaging follow up, or if she needed help to schedule.

## 2017-07-02 NOTE — Telephone Encounter (Signed)
Call to patient. Patient updated of appointment date and time of Tuesday 07/10/17 at 1120, arrival time of 1050. Patient agreeable to date and time of appointments.

## 2017-07-02 NOTE — Telephone Encounter (Signed)
Patient in 04 recall for 05/2017. Patient needs 6 month F/U breast imaging. Please contact patient regarding scheduling -thanks

## 2017-07-02 NOTE — Telephone Encounter (Signed)
Patient returned call. Patient states she has not had 6 month MMG follow up. RN advised she would call to schedule for patient.

## 2017-07-02 NOTE — Telephone Encounter (Signed)
Spoke with Samantha Hoover at [facility] scheduling. Patient scheduled for 6 month follow diagnostic MMG of right breast for Tuesday 07/10/17 at 1120.

## 2017-07-09 ENCOUNTER — Telehealth: Payer: Self-pay | Admitting: Obstetrics & Gynecology

## 2017-07-09 NOTE — Telephone Encounter (Signed)
Written order to Dr. Sabra Heck for signature.

## 2017-07-09 NOTE — Telephone Encounter (Signed)
Order faxed to [facility] at [phone]. Will close encounter.

## 2017-07-09 NOTE — Telephone Encounter (Signed)
Samantha Hoover with [facility] calling to get order faxed for diagnostic mammogram scheduled for tomorrow. The fax number is [phone]

## 2017-08-13 ENCOUNTER — Encounter: Payer: Self-pay | Admitting: Obstetrics & Gynecology

## 2017-11-01 DIAGNOSIS — Z8249 Family history of ischemic heart disease and other diseases of the circulatory system: Secondary | ICD-10-CM

## 2017-11-01 HISTORY — DX: Family history of ischemic heart disease and other diseases of the circulatory system: Z82.49

## 2018-03-13 ENCOUNTER — Other Ambulatory Visit: Payer: Self-pay | Admitting: Obstetrics & Gynecology

## 2018-03-13 NOTE — Telephone Encounter (Signed)
Medication refill request: Maxalt  Last AEX:  01-30-17  Next AEX: 04-19-18  Last MMG (if hormonal medication request): 08-14-17 WNL  Refill authorized: please advise

## 2018-04-09 ENCOUNTER — Encounter: Payer: Self-pay | Admitting: Obstetrics & Gynecology

## 2018-04-09 NOTE — Telephone Encounter (Signed)
Patient sent the following message through [place]. Routing to triage to assist patient with request.  ----- Message from [place], Generic sent at 04/09/2018 2:35 PM EDT -----    When is my next mammogram due? My schedule is a bit different since I had the calcification. Samantha Hoover

## 2018-04-19 ENCOUNTER — Other Ambulatory Visit: Payer: Self-pay

## 2018-04-19 ENCOUNTER — Encounter: Payer: Self-pay | Admitting: Obstetrics & Gynecology

## 2018-04-19 ENCOUNTER — Ambulatory Visit (INDEPENDENT_AMBULATORY_CARE_PROVIDER_SITE_OTHER): Payer: 59 | Admitting: Obstetrics & Gynecology

## 2018-04-19 VITALS — BP 116/64 | HR 84 | Resp 16 | Ht 66.5 in | Wt 171.2 lb

## 2018-04-19 DIAGNOSIS — Z01419 Encounter for gynecological examination (general) (routine) without abnormal findings: Secondary | ICD-10-CM
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
## 2018-04-22 ENCOUNTER — Encounter

## 2018-05-07 ENCOUNTER — Telehealth: Payer: Self-pay | Admitting: Obstetrics & Gynecology

## 2018-05-07 ENCOUNTER — Encounter: Payer: Self-pay | Admitting: Obstetrics & Gynecology

## 2018-05-07 ENCOUNTER — Other Ambulatory Visit: Payer: Self-pay | Admitting: Obstetrics & Gynecology

## 2018-05-07 MED ORDER — CLOBETASOL PROPIONATE 0.05 % EX OINT: TOPICAL_OINTMENT | Freq: Two times a day (BID) | CUTANEOUS | 1 refills | Status: DC

## 2018-05-07 NOTE — Telephone Encounter (Signed)
Patient sent the following correspondence through [place]. Routing to triage to assist patient with request.  ----- Message from [place], Generic sent at 05/07/2018 7:23 AM EDT -----    Could you call in clobetasol to [facility]? Thanks! Almyra Free

## 2018-05-07 NOTE — Telephone Encounter (Signed)
Med refill request: Clobetasol ointment Last AEX: 04/19/18 Next AEX: 06/03/19  Pharmacy updated.   Dr. Sabra Heck please advise on refill. Hx of spongiotic dermatitis.

## 2018-05-07 NOTE — Telephone Encounter (Signed)
Order completed and sent to [facility].

## 2018-05-08 NOTE — Telephone Encounter (Signed)
Patient notified of refill.   Encounter closed.  

## 2018-05-10 ENCOUNTER — Telehealth: Payer: Self-pay | Admitting: *Deleted

## 2018-05-10 ENCOUNTER — Encounter: Payer: Self-pay | Admitting: Obstetrics & Gynecology

## 2018-05-10 NOTE — Telephone Encounter (Signed)
Call to patient. Advised order has been faxed. Patient anxious to get this appointment scheduled. Requesting diagnosis and codes. Reviewed with patient as per order that was faxed. She will call [facility] now to  check on scheduling.  Routing to provider for final review. Will close encounter.

## 2018-05-10 NOTE — Telephone Encounter (Signed)
My Chart message from patient:  ----- Message from [place], Generic sent at 05/10/2018 3:55 PM EDT -----    What did the radiologist say about my mammogram?  Select Font Size      Samantha Hoover  05/10/2018  Patient Email  MRN:  [id number]  Description: 52 year old female Provider: Megan Salon, MD Department: [facility]

## 2018-05-10 NOTE — Telephone Encounter (Signed)
Called patient. We received a report from insurance stating they do not approve clobetasol 30g. They would approve clobetasol 15g  Patient states she already picked Rx.  Report to scan.   Patient states we need to send codes and order to [facility] for diagnostic MMG and [place].  New order with codes faxed to [facility] today.  Order to Scan.

## 2018-07-17 ENCOUNTER — Encounter: Payer: Self-pay | Admitting: Obstetrics & Gynecology

## 2018-10-12 ENCOUNTER — Encounter: Payer: Self-pay | Admitting: Obstetrics & Gynecology

## 2018-10-14 ENCOUNTER — Telehealth: Payer: Self-pay | Admitting: *Deleted

## 2018-10-14 NOTE — Telephone Encounter (Signed)
Responded to patients mychart message via mychart:     [name],   I have reviewed your mammograms and your next bilateral screening mammogram is not due until after 05/03/2019. Your last bilateral screening mammogram was June 20th 2019.When you went back for additional imaging in July, the radiologist recommended you follow up in one year for a bilateral screening mammogram. So you are caught up until then, unless you have any problems with your breasts before you next screening is due, then please let us know.   Encounter closed.

## 2018-10-14 NOTE — Telephone Encounter (Signed)
I have a question about HM MAMMOGRAPHY resulted on 05/23/18, 9:04 AM.    Shouldn't I have another mammogram soon? Samantha Hoover.

## 2019-05-29 ENCOUNTER — Other Ambulatory Visit: Payer: Self-pay

## 2019-06-02 ENCOUNTER — Encounter: Payer: Self-pay | Admitting: Obstetrics & Gynecology

## 2019-06-02 ENCOUNTER — Other Ambulatory Visit: Payer: Self-pay

## 2019-06-02 ENCOUNTER — Other Ambulatory Visit (HOSPITAL_COMMUNITY)
Admission: RE | Admit: 2019-06-02 | Discharge: 2019-06-02 | Disposition: A | Discharge status: Home / Self Care | Payer: 59 | Source: Ambulatory Visit | Attending: Obstetrics & Gynecology | Admitting: Obstetrics & Gynecology

## 2019-06-02 ENCOUNTER — Ambulatory Visit: Payer: 59 | Admitting: Obstetrics & Gynecology

## 2019-06-02 VITALS — BP 110/70 | HR 74 | Temp 97.2°F | Ht 66.75 in | Wt 169.0 lb

## 2019-06-02 DIAGNOSIS — Z0000 Encounter for general adult medical examination without abnormal findings: Secondary | ICD-10-CM | POA: Diagnosis not present

## 2019-06-02 DIAGNOSIS — Z124 Encounter for screening for malignant neoplasm of cervix: Secondary | ICD-10-CM | POA: Insufficient documentation

## 2019-06-02 DIAGNOSIS — R6882 Decreased libido: Secondary | ICD-10-CM | POA: Diagnosis not present

## 2019-06-02 DIAGNOSIS — Z01419 Encounter for gynecological examination (general) (routine) without abnormal findings: Secondary | ICD-10-CM

## 2019-06-02 MED ORDER — RIZATRIPTAN BENZOATE 10 MG PO TABS: ORAL_TABLET | ORAL | 3 refills | Status: DC

## 2019-06-02 NOTE — Patient Instructions (Signed)
Replens vaginal moisturizer--use vaginally one to two times weekly Can use Vit E vaginal suppositories--Key E vaginal suppositories Can use olive oil as well for a lubricant to help with dryness
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
## 2019-06-03 ENCOUNTER — Ambulatory Visit: Payer: 59 | Admitting: Obstetrics & Gynecology

## 2019-06-03 LAB — CYTOLOGY - PAP
Diagnosis: NEGATIVE
HPV: NOT DETECTED

## 2019-06-05 LAB — COMPREHENSIVE METABOLIC PANEL
ALT: 11 IU/L (ref 0–32)
AST: 13 IU/L (ref 0–40)
Albumin/Globulin Ratio: 1.9 (ref 1.2–2.2)
Albumin: 4.5 g/dL (ref 3.8–4.9)
Alkaline Phosphatase: 80 IU/L (ref 39–117)
BUN/Creatinine Ratio: 14 (ref 9–23)
BUN: 11 mg/dL (ref 6–24)
Bilirubin Total: 0.5 mg/dL (ref 0.0–1.2)
CO2: 25 mmol/L (ref 20–29)
Calcium: 9.7 mg/dL (ref 8.7–10.2)
Chloride: 99 mmol/L (ref 96–106)
Creatinine, Ser: 0.79 mg/dL (ref 0.57–1.00)
GFR calc Af Amer: 99 mL/min/{1.73_m2} (ref >59)
GFR calc non Af Amer: 86 mL/min/{1.73_m2} (ref >59)
Globulin, Total: 2.4 g/dL (ref 1.5–4.5)
Glucose: 81 mg/dL (ref 65–99)
Potassium: 4.2 mmol/L (ref 3.5–5.2)
Sodium: 140 mmol/L (ref 134–144)
Total Protein: 6.9 g/dL (ref 6.0–8.5)

## 2019-06-05 LAB — LIPID PANEL
Chol/HDL Ratio: 2.9 ratio (ref 0.0–4.4)
Cholesterol, Total: 216 mg/dL — ABNORMAL HIGH (ref 100–199)
HDL: 75 mg/dL (ref >39)
LDL Calculated: 124 mg/dL — ABNORMAL HIGH (ref 0–99)
Triglycerides: 85 mg/dL (ref 0–149)
VLDL Cholesterol Cal: 17 mg/dL (ref 5–40)

## 2019-06-05 LAB — CBC
Hematocrit: 41.4 % (ref 34.0–46.6)
Hemoglobin: 13.3 g/dL (ref 11.1–15.9)
MCH: 30.2 pg (ref 26.6–33.0)
MCHC: 32.1 g/dL (ref 31.5–35.7)
MCV: 94 fL (ref 79–97)
Platelets: 291 10*3/uL (ref 150–450)
RBC: 4.41 x10E6/uL (ref 3.77–5.28)
RDW: 13.4 % (ref 11.7–15.4)
WBC: 5.4 10*3/uL (ref 3.4–10.8)

## 2019-06-05 LAB — TSH: TSH: 1.98 u[IU]/mL (ref 0.450–4.500)

## 2019-06-05 LAB — TESTOSTERONE, TOTAL, LC/MS/MS: Testosterone, total: 20.9 ng/dL

## 2019-06-05 LAB — VITAMIN D 25 HYDROXY (VIT D DEFICIENCY, FRACTURES): Vit D, 25-Hydroxy: 27.8 ng/mL — ABNORMAL LOW (ref 30.0–100.0)

## 2019-06-20 ENCOUNTER — Encounter: Payer: Self-pay | Admitting: Gastroenterology

## 2019-07-14 ENCOUNTER — Telehealth: Payer: Self-pay | Admitting: *Deleted

## 2019-07-14 ENCOUNTER — Ambulatory Visit (AMBULATORY_SURGERY_CENTER): Payer: Self-pay | Admitting: *Deleted

## 2019-07-14 ENCOUNTER — Other Ambulatory Visit: Payer: Self-pay

## 2019-07-14 VITALS — Temp 96.9°F | Ht 67.0 in | Wt 172.0 lb

## 2019-07-14 DIAGNOSIS — Z86010 Personal history of colonic polyps: Secondary | ICD-10-CM

## 2019-07-14 MED ORDER — NA SULFATE-K SULFATE-MG SULF 17.5-3.13-1.6 GM/177ML PO SOLN: ORAL | 0 refills | Status: DC

## 2019-07-14 NOTE — Progress Notes (Signed)
Patient is here in-person for PV. Patient denies any allergies to eggs or soy. Patient denies any problems with anesthesia/sedation. Patient denies any oxygen use at home. Patient denies taking any diet/weight loss medications or blood thinners. EMMI education assisgned to patient on colonoscopy, this was explained and instructions given to patient.Pt is aware that care partner will wait in the car during procedure; if they feel like they will be too hot to wait in the car; they may wait in the lobby.  We want them to wear a mask (we do not have any that we can provide them), practice social distancing, and we will check their temperatures when they get here.  I did remind patient that their care partner needs to stay in the parking lot the entire time. Pt will wear mask into building. suprep coupon given to pt.

## 2019-07-14 NOTE — Telephone Encounter (Signed)
Patient is here for PV. In HDA, Her last colonoscopy was on 04/03/2016 with you, "1 cm flat sessile polyp removed from rectum" =Tubular adenoma. No family hx and no GI concerns. Is the patient due for colonoscopy at this time? Please advise. Thank you, Robbin pv

## 2019-07-15 ENCOUNTER — Encounter: Payer: Self-pay | Admitting: Obstetrics & Gynecology

## 2019-07-15 NOTE — Telephone Encounter (Signed)
Yes, I think so Size citerion - 72yrs So, 03/2019 Thx RG

## 2019-07-16 NOTE — Telephone Encounter (Signed)
Patient called and given Dr.Gupta recommendations. No further questions from the pt at this time.

## 2019-07-22 ENCOUNTER — Encounter: Payer: Self-pay | Admitting: Gastroenterology

## 2019-08-01 ENCOUNTER — Telehealth: Payer: Self-pay | Admitting: Internal Medicine

## 2019-08-01 ENCOUNTER — Telehealth: Payer: Self-pay | Admitting: Gastroenterology

## 2019-08-01 NOTE — Telephone Encounter (Signed)

## 2019-08-04 ENCOUNTER — Encounter: Payer: Self-pay | Admitting: Gastroenterology

## 2019-08-04 ENCOUNTER — Ambulatory Visit (AMBULATORY_SURGERY_CENTER): Payer: 59 | Admitting: Gastroenterology

## 2019-08-04 ENCOUNTER — Other Ambulatory Visit: Payer: Self-pay

## 2019-08-04 VITALS — BP 132/82 | HR 62 | Temp 98.7°F | Resp 14 | Ht 64.0 in | Wt 172.0 lb

## 2019-08-04 DIAGNOSIS — Z86010 Personal history of colonic polyps: Secondary | ICD-10-CM

## 2019-08-04 MED ORDER — SODIUM CHLORIDE 0.9 % IV SOLN: 500.0000 mL | Freq: Once | INTRAVENOUS | Status: DC

## 2019-08-04 NOTE — Progress Notes (Signed)
A and O x3. Report to RN. Tolerated MAC anesthesia well.

## 2019-08-04 NOTE — Patient Instructions (Signed)
Please read handouts provided. Continue present medications.     YOU HAD AN ENDOSCOPIC PROCEDURE TODAY AT THE [facility]:   Refer to the procedure report that was given to you for any specific questions about what was found during the examination.  If the procedure report does not answer your questions, please call your gastroenterologist to clarify.  If you requested that your care partner not be given the details of your procedure findings, then the procedure report has been included in a sealed envelope for you to review at your convenience later.  YOU SHOULD EXPECT: Some feelings of bloating in the abdomen. Passage of more gas than usual.  Walking can help get rid of the air that was put into your GI tract during the procedure and reduce the bloating. If you had a lower endoscopy (such as a colonoscopy or flexible sigmoidoscopy) you may notice spotting of blood in your stool or on the toilet paper. If you underwent a bowel prep for your procedure, you may not have a normal bowel movement for a few days.  Please Note:  You might notice some irritation and congestion in your nose or some drainage.  This is from the oxygen used during your procedure.  There is no need for concern and it should clear up in a day or so.  SYMPTOMS TO REPORT IMMEDIATELY:   Following lower endoscopy (colonoscopy or flexible sigmoidoscopy):  Excessive amounts of blood in the stool  Significant tenderness or worsening of abdominal pains  Swelling of the abdomen that is new, acute  Fever of 100F or higher   For urgent or emergent issues, a gastroenterologist can be reached at any hour by calling [phone].   DIET:  We do recommend a small meal at first, but then you may proceed to your regular diet.  Drink plenty of fluids but you should avoid alcoholic beverages for 24 hours.  ACTIVITY:  You should plan to take it easy for the rest of today and you should NOT DRIVE or use heavy machinery  until tomorrow (because of the sedation medicines used during the test).    FOLLOW UP: Our staff will call the number listed on your records 48-72 hours following your procedure to check on you and address any questions or concerns that you may have regarding the information given to you following your procedure. If we do not reach you, we will leave a message.  We will attempt to reach you two times.  During this call, we will ask if you have developed any symptoms of COVID 19. If you develop any symptoms (ie: fever, flu-like symptoms, shortness of breath, cough etc.) before then, please call [phone].  If you test positive for Covid 19 in the 2 weeks post procedure, please call and report this information to us.    If any biopsies were taken you will be contacted by phone or by letter within the next 1-3 weeks.  Please call us at [phone] if you have not heard about the biopsies in 3 weeks.    SIGNATURES/CONFIDENTIALITY: You and/or your care partner have signed paperwork which will be entered into your electronic medical record.  These signatures attest to the fact that that the information above on your After Visit Summary has been reviewed and is understood.  Full responsibility of the confidentiality of this discharge information lies with you and/or your care-partner. 

## 2019-08-04 NOTE — Progress Notes (Signed)
Called to room to assist during endoscopic procedure.  Patient ID and intended procedure confirmed with present staff. Received instructions for my participation in the procedure from the performing physician.  

## 2019-08-04 NOTE — Op Note (Signed)
[facility] Patient Name: Samantha Hoover Procedure Date: 08/04/2019 1:04 PM MRN: [id number] Endoscopist: Jackquline Denmark , MD Age: 53 Referring MD:  Date of Birth: January 06, 1966 Gender: Female Account #: [id number] Procedure:                Colonoscopy Indications:              High risk colon cancer surveillance: Personal                            history of colonic polyps Medicines:                Monitored Anesthesia Care Procedure:                Pre-Anesthesia Assessment:                           - Prior to the procedure, a History and Physical                            was performed, and patient medications and                            allergies were reviewed. The patient's tolerance of                            previous anesthesia was also reviewed. The risks                            and benefits of the procedure and the sedation                            options and risks were discussed with the patient.                            All questions were answered, and informed consent                            was obtained. Prior Anticoagulants: The patient has                            taken no previous anticoagulant or antiplatelet                            agents. ASA Grade Assessment: II - A patient with                            mild systemic disease. After reviewing the risks                            and benefits, the patient was deemed in                            satisfactory condition to undergo the procedure.  After obtaining informed consent, the colonoscope                            was passed under direct vision. Throughout the                            procedure, the patient's blood pressure, pulse, and                            oxygen saturations were monitored continuously. The                            Colonoscope was introduced through the anus and                            advanced to the 2 cm into the ileum. The                             colonoscopy was performed without difficulty. The                            patient tolerated the procedure well. The quality                            of the bowel preparation was excellent. The                            terminal ileum, ileocecal valve, appendiceal                            orifice, and rectum were photographed. Scope In: 1:33:44 PM Scope Out: 1:47:42 PM Scope Withdrawal Time: 0 hours 7 minutes 12 seconds  Total Procedure Duration: 0 hours 13 minutes 58 seconds  Findings:                 Non-bleeding internal hemorrhoids were found during                            retroflexion. The hemorrhoids were small.                           The colon (entire examined portion) appeared normal.                           The terminal ileum appeared normal.                           The exam was otherwise without abnormality on                            direct and retroflexion views. The colon was                            redundant. Complications:            No immediate complications.  Estimated Blood Loss:     Estimated blood loss: none. Impression:               -Minimal internal hemorrhoids.                           -Otherwise normal colonoscopy to TI. The colon was                            redundant. Recommendation:           - Patient has a contact number available for                            emergencies. The signs and symptoms of potential                            delayed complications were discussed with the                            patient. Return to normal activities tomorrow.                            Written discharge instructions were provided to the                            patient.                           - Resume previous diet.                           - Continue present medications.                           - Repeat colonoscopy in 10 years for screening                            purposes. Earlier, if with any new  problems or if                            there is any change in family history.                           - Return to GI office PRN.                           - D/W Waunita Schooner. Jackquline Denmark, MD 08/04/2019 1:52:51 PM This report has been signed electronically.

## 2019-08-06 ENCOUNTER — Telehealth: Payer: Self-pay

## 2019-08-06 NOTE — Telephone Encounter (Signed)
  Follow up Call-  Call back number 08/04/2019  Post procedure Call Back phone  # [phone]  Permission to leave phone message Yes  Some recent data might be hidden     Patient questions:  Do you have a fever, pain , or abdominal swelling? No. Pain Score  0 *  Have you tolerated food without any problems? Yes.    Have you been able to return to your normal activities? Yes.    Do you have any questions about your discharge instructions: Diet   No. Medications  No. Follow up visit  No.  Do you have questions or concerns about your Care? No.  Actions: * If pain score is 4 or above: No action needed, pain <4.  1. Have you developed a fever since your procedure? no  2.   Have you had an respiratory symptoms (SOB or cough) since your procedure? no  3.   Have you tested positive for COVID 19 since your procedure no  4.   Have you had any family members/close contacts diagnosed with the COVID 19 since your procedure?  no   If yes to any of these questions please route to Joylene John, RN and Alphonsa Gin, Therapist, sports.

## 2019-09-19 ENCOUNTER — Ambulatory Visit: Payer: 59 | Admitting: Obstetrics & Gynecology

## 2019-09-19 ENCOUNTER — Encounter: Payer: Self-pay | Admitting: Obstetrics & Gynecology

## 2019-09-19 ENCOUNTER — Telehealth: Payer: Self-pay | Admitting: Obstetrics & Gynecology

## 2019-09-19 ENCOUNTER — Other Ambulatory Visit: Payer: Self-pay

## 2019-09-19 VITALS — BP 138/72 | HR 80 | Temp 97.7°F | Resp 12 | Ht 66.75 in | Wt 177.8 lb

## 2019-09-19 DIAGNOSIS — N898 Other specified noninflammatory disorders of vagina: Secondary | ICD-10-CM

## 2019-09-19 MED ORDER — TINIDAZOLE 500 MG PO TABS: ORAL_TABLET | ORAL | 0 refills | Status: DC
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
## 2019-09-19 NOTE — Telephone Encounter (Signed)
Spoke with pt. Pt trying to get through since 11/5. Pt states having dryness "as the desert" with discharge over the last 2 months but has become worse in the last 2 weeks now with slight odor. Denies pain, burning, frequency, and urgency. Scheduled OV 11/6 at 1:30pm. Pt agreeable. Routing to provider for final review. Patient is agreeable to disposition. Will close encounter.

## 2019-09-19 NOTE — Telephone Encounter (Signed)
Patient sent the following message through [place]. Routing to triage to assist patient with request.  Angelina Ok "Almyra Free"  P [facility]  Phone Number: [phone]        I have been trying to contact your office since yesterday and have been unable to get through and am getting frustrated. I need to speak with a nurse. I am going out of town Monday. Please call me at [phone]. Lanny Cramp

## 2019-09-20 LAB — VAGINITIS/VAGINOSIS, DNA PROBE
Candida Species: NEGATIVE
Gardnerella vaginalis: POSITIVE — AB
Trichomonas vaginosis: NEGATIVE

## 2019-09-22 ENCOUNTER — Encounter: Payer: Self-pay | Admitting: Obstetrics & Gynecology

## 2019-09-28 ENCOUNTER — Encounter: Payer: Self-pay | Admitting: Obstetrics & Gynecology

## 2019-10-04 ENCOUNTER — Encounter: Payer: Self-pay | Admitting: Obstetrics & Gynecology

## 2020-05-26 ENCOUNTER — Telehealth: Payer: Self-pay | Admitting: Gastroenterology

## 2020-05-26 NOTE — Telephone Encounter (Signed)
Excellent plan Samantha Hoover

## 2020-05-26 NOTE — Telephone Encounter (Signed)
Pt is requesting a call back from a nurse regarding her recent rectal bleeding she is experiencing.

## 2020-05-26 NOTE — Telephone Encounter (Signed)
Spoke to patient this morning who reports small amount of rectal bleeding after having a bowel movement. She was on vacation last week and ate a lot of different foods then traveled in a car for 10 hours. Patient noted blood after having a very  Hard formed bowel movement twice this week. 2020 colonoscopy noted small internal hemorrhoids. She was advised to try OTC Preparation H Suppositories,drink plenty of  Water and eat fresh fruits and verge's. To keep stools soft. Patient will report if she notices any further bleeding.

## 2020-06-02 ENCOUNTER — Telehealth: Payer: Self-pay | Admitting: *Deleted

## 2020-06-02 DIAGNOSIS — E785 Hyperlipidemia, unspecified: Secondary | ICD-10-CM

## 2020-06-02 NOTE — Telephone Encounter (Signed)
Patient is returning call.  °

## 2020-06-02 NOTE — Telephone Encounter (Signed)
Spoke with patient. Patient is scheduled for an AEX on 06/07/20 at 3:30pm. Patient is requesting to have fasting labs at a location in [place] prior to her appt.   Reviewed opt of [facility], [address]. Advised patient she will need to contact the lab to confirm hours and if an appt is needed. I will review with Dr. Sabra Heck and advise on lab orders once placed. Patient states she has not had any labs since 05/2019. Patient agreeable.    Last labs in Epic from AEX on 06/02/19: CBC, CMP, Lipid panel, TSH, Vit D, testosterone.   Dr. Sabra Heck -please advise on furture lab orders for outside lab facility.

## 2020-06-02 NOTE — Telephone Encounter (Signed)
Left message to call , RN at [facility] [phone].   

## 2020-06-02 NOTE — Telephone Encounter (Signed)
Patient would like to speak with nurse about getting an order for fasting labs before AEX on Monday.

## 2020-06-02 NOTE — Telephone Encounter (Signed)
Spoke with patient. Patient states she confirmed [facility] in [place] is open, she has scheduled an appt for 7/22. Advised patient Dr. Sabra Heck has already left the office, will need lab orders before you can get labs. Will review with Dr. Sabra Heck on 7/22 and return call to notify once orders have been placed. Patient verbalizes understanding and is agreeable. She will change her Lab appt to 7/23.
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
## 2020-06-03 NOTE — Telephone Encounter (Signed)
Patient is returning call.  °

## 2020-06-03 NOTE — Telephone Encounter (Signed)
Reviewed with Dr. Sabra Heck, lipid panel recommended. Dx: hyperlipidemia. Future lab order placed and released.    Call placed to patient, left message advising I would send MyChart message, return call to office if any additional questions.   MyChart message to patient.   Routing to Dr. Lestine Box.   Encounter closed.

## 2020-06-04 NOTE — Telephone Encounter (Signed)
MyChart message to patient.   Lab order discontinued.   Routing to Dr. Lestine Box.

## 2020-06-04 NOTE — Addendum Note (Signed)
Addended by: Burnice Logan on: 06/04/2020 08:43 AM   Modules accepted: Orders

## 2020-06-07 ENCOUNTER — Other Ambulatory Visit: Payer: Self-pay

## 2020-06-07 ENCOUNTER — Encounter: Payer: Self-pay | Admitting: Obstetrics & Gynecology

## 2020-06-07 ENCOUNTER — Ambulatory Visit: Payer: 59 | Admitting: Obstetrics & Gynecology

## 2020-06-07 VITALS — BP 110/74 | HR 80 | Resp 16 | Ht 66.75 in | Wt 179.0 lb

## 2020-06-07 DIAGNOSIS — Z124 Encounter for screening for malignant neoplasm of cervix: Secondary | ICD-10-CM

## 2020-06-07 DIAGNOSIS — Z01419 Encounter for gynecological examination (general) (routine) without abnormal findings: Secondary | ICD-10-CM

## 2020-06-07 DIAGNOSIS — Z23 Encounter for immunization: Secondary | ICD-10-CM

## 2020-06-07 MED ORDER — RIZATRIPTAN BENZOATE 10 MG PO TABS: ORAL_TABLET | ORAL | 3 refills | Status: DC

## 2020-06-18 NOTE — Addendum Note (Signed)
Addended by: Megan Salon on: 06/18/2020 05:10 PM   Modules accepted: Orders

## 2020-07-22 ENCOUNTER — Ambulatory Visit: Payer: 59 | Admitting: Obstetrics & Gynecology

## 2020-07-22 DIAGNOSIS — Z85820 Personal history of malignant melanoma of skin: Secondary | ICD-10-CM

## 2020-07-22 HISTORY — DX: Personal history of malignant melanoma of skin: Z85.820

## 2020-08-09 ENCOUNTER — Ambulatory Visit: Payer: 59 | Admitting: Obstetrics & Gynecology

## 2021-12-08 DIAGNOSIS — F4322 Adjustment disorder with anxiety: Secondary | ICD-10-CM | POA: Diagnosis not present

## 2021-12-08 DIAGNOSIS — F4310 Post-traumatic stress disorder, unspecified: Secondary | ICD-10-CM | POA: Diagnosis not present

## 2021-12-08 DIAGNOSIS — F411 Generalized anxiety disorder: Secondary | ICD-10-CM | POA: Diagnosis not present

## 2021-12-15 DIAGNOSIS — F4310 Post-traumatic stress disorder, unspecified: Secondary | ICD-10-CM | POA: Diagnosis not present

## 2021-12-15 DIAGNOSIS — F411 Generalized anxiety disorder: Secondary | ICD-10-CM | POA: Diagnosis not present

## 2021-12-15 DIAGNOSIS — F4322 Adjustment disorder with anxiety: Secondary | ICD-10-CM | POA: Diagnosis not present

## 2021-12-28 DIAGNOSIS — F411 Generalized anxiety disorder: Secondary | ICD-10-CM | POA: Diagnosis not present

## 2021-12-28 DIAGNOSIS — F4322 Adjustment disorder with anxiety: Secondary | ICD-10-CM | POA: Diagnosis not present

## 2021-12-28 DIAGNOSIS — F4310 Post-traumatic stress disorder, unspecified: Secondary | ICD-10-CM | POA: Diagnosis not present

## 2022-01-06 DIAGNOSIS — F4322 Adjustment disorder with anxiety: Secondary | ICD-10-CM | POA: Diagnosis not present

## 2022-01-06 DIAGNOSIS — F411 Generalized anxiety disorder: Secondary | ICD-10-CM | POA: Diagnosis not present

## 2022-01-06 DIAGNOSIS — F4310 Post-traumatic stress disorder, unspecified: Secondary | ICD-10-CM | POA: Diagnosis not present

## 2022-01-09 DIAGNOSIS — L218 Other seborrheic dermatitis: Secondary | ICD-10-CM | POA: Diagnosis not present

## 2022-01-09 DIAGNOSIS — D2271 Melanocytic nevi of right lower limb, including hip: Secondary | ICD-10-CM | POA: Diagnosis not present

## 2022-01-09 DIAGNOSIS — L718 Other rosacea: Secondary | ICD-10-CM | POA: Diagnosis not present

## 2022-01-09 DIAGNOSIS — D225 Melanocytic nevi of trunk: Secondary | ICD-10-CM | POA: Diagnosis not present

## 2022-01-09 DIAGNOSIS — Z85828 Personal history of other malignant neoplasm of skin: Secondary | ICD-10-CM | POA: Diagnosis not present

## 2022-01-09 DIAGNOSIS — D485 Neoplasm of uncertain behavior of skin: Secondary | ICD-10-CM | POA: Diagnosis not present

## 2022-01-13 DIAGNOSIS — F4322 Adjustment disorder with anxiety: Secondary | ICD-10-CM | POA: Diagnosis not present

## 2022-01-13 DIAGNOSIS — F4310 Post-traumatic stress disorder, unspecified: Secondary | ICD-10-CM | POA: Diagnosis not present

## 2022-01-13 DIAGNOSIS — F411 Generalized anxiety disorder: Secondary | ICD-10-CM | POA: Diagnosis not present

## 2022-02-02 DIAGNOSIS — F411 Generalized anxiety disorder: Secondary | ICD-10-CM | POA: Diagnosis not present

## 2022-02-02 DIAGNOSIS — F4322 Adjustment disorder with anxiety: Secondary | ICD-10-CM | POA: Diagnosis not present

## 2022-02-02 DIAGNOSIS — F4310 Post-traumatic stress disorder, unspecified: Secondary | ICD-10-CM | POA: Diagnosis not present

## 2022-02-09 DIAGNOSIS — L988 Other specified disorders of the skin and subcutaneous tissue: Secondary | ICD-10-CM | POA: Diagnosis not present

## 2022-02-09 DIAGNOSIS — D485 Neoplasm of uncertain behavior of skin: Secondary | ICD-10-CM | POA: Diagnosis not present

## 2022-02-10 DIAGNOSIS — F411 Generalized anxiety disorder: Secondary | ICD-10-CM | POA: Diagnosis not present

## 2022-02-10 DIAGNOSIS — F4310 Post-traumatic stress disorder, unspecified: Secondary | ICD-10-CM | POA: Diagnosis not present

## 2022-02-10 DIAGNOSIS — F4322 Adjustment disorder with anxiety: Secondary | ICD-10-CM | POA: Diagnosis not present

## 2022-03-02 DIAGNOSIS — F4322 Adjustment disorder with anxiety: Secondary | ICD-10-CM | POA: Diagnosis not present

## 2022-03-02 DIAGNOSIS — F411 Generalized anxiety disorder: Secondary | ICD-10-CM | POA: Diagnosis not present

## 2022-03-02 DIAGNOSIS — F4310 Post-traumatic stress disorder, unspecified: Secondary | ICD-10-CM | POA: Diagnosis not present

## 2022-03-16 DIAGNOSIS — F4322 Adjustment disorder with anxiety: Secondary | ICD-10-CM | POA: Diagnosis not present

## 2022-03-16 DIAGNOSIS — F411 Generalized anxiety disorder: Secondary | ICD-10-CM | POA: Diagnosis not present

## 2022-03-16 DIAGNOSIS — F4310 Post-traumatic stress disorder, unspecified: Secondary | ICD-10-CM | POA: Diagnosis not present

## 2022-03-31 DIAGNOSIS — F4310 Post-traumatic stress disorder, unspecified: Secondary | ICD-10-CM | POA: Diagnosis not present

## 2022-03-31 DIAGNOSIS — F411 Generalized anxiety disorder: Secondary | ICD-10-CM | POA: Diagnosis not present

## 2022-03-31 DIAGNOSIS — F4322 Adjustment disorder with anxiety: Secondary | ICD-10-CM | POA: Diagnosis not present

## 2022-04-27 DIAGNOSIS — F5105 Insomnia due to other mental disorder: Secondary | ICD-10-CM | POA: Diagnosis not present

## 2022-04-27 DIAGNOSIS — F4321 Adjustment disorder with depressed mood: Secondary | ICD-10-CM | POA: Diagnosis not present

## 2022-07-11 DIAGNOSIS — Z85820 Personal history of malignant melanoma of skin: Secondary | ICD-10-CM | POA: Diagnosis not present

## 2022-07-11 DIAGNOSIS — D2261 Melanocytic nevi of right upper limb, including shoulder: Secondary | ICD-10-CM | POA: Diagnosis not present

## 2022-07-11 DIAGNOSIS — D225 Melanocytic nevi of trunk: Secondary | ICD-10-CM | POA: Diagnosis not present

## 2022-07-11 DIAGNOSIS — D2262 Melanocytic nevi of left upper limb, including shoulder: Secondary | ICD-10-CM | POA: Diagnosis not present

## 2022-07-14 DIAGNOSIS — M79644 Pain in right finger(s): Secondary | ICD-10-CM | POA: Diagnosis not present

## 2022-08-01 DIAGNOSIS — M79644 Pain in right finger(s): Secondary | ICD-10-CM | POA: Diagnosis not present

## 2022-08-03 DIAGNOSIS — Z23 Encounter for immunization: Secondary | ICD-10-CM | POA: Diagnosis not present

## 2022-08-03 DIAGNOSIS — Z0000 Encounter for general adult medical examination without abnormal findings: Secondary | ICD-10-CM | POA: Diagnosis not present

## 2022-08-03 DIAGNOSIS — Z8249 Family history of ischemic heart disease and other diseases of the circulatory system: Secondary | ICD-10-CM | POA: Insufficient documentation

## 2022-08-03 DIAGNOSIS — I8393 Asymptomatic varicose veins of bilateral lower extremities: Secondary | ICD-10-CM | POA: Diagnosis not present

## 2022-08-03 DIAGNOSIS — M47812 Spondylosis without myelopathy or radiculopathy, cervical region: Secondary | ICD-10-CM | POA: Diagnosis not present

## 2022-08-03 DIAGNOSIS — Z85828 Personal history of other malignant neoplasm of skin: Secondary | ICD-10-CM | POA: Diagnosis not present

## 2022-08-03 HISTORY — DX: Family history of ischemic heart disease and other diseases of the circulatory system: Z82.49

## 2022-08-08 DIAGNOSIS — F4321 Adjustment disorder with depressed mood: Secondary | ICD-10-CM | POA: Diagnosis not present

## 2022-08-08 DIAGNOSIS — Z8249 Family history of ischemic heart disease and other diseases of the circulatory system: Secondary | ICD-10-CM | POA: Diagnosis not present

## 2022-08-08 DIAGNOSIS — Z8669 Personal history of other diseases of the nervous system and sense organs: Secondary | ICD-10-CM | POA: Diagnosis not present

## 2022-08-08 DIAGNOSIS — R5383 Other fatigue: Secondary | ICD-10-CM | POA: Diagnosis not present

## 2022-08-08 DIAGNOSIS — F5105 Insomnia due to other mental disorder: Secondary | ICD-10-CM | POA: Diagnosis not present

## 2022-08-08 DIAGNOSIS — Z9889 Other specified postprocedural states: Secondary | ICD-10-CM | POA: Diagnosis not present

## 2022-08-08 DIAGNOSIS — M47812 Spondylosis without myelopathy or radiculopathy, cervical region: Secondary | ICD-10-CM | POA: Diagnosis not present

## 2022-08-08 DIAGNOSIS — Z13220 Encounter for screening for lipoid disorders: Secondary | ICD-10-CM | POA: Diagnosis not present

## 2022-08-08 DIAGNOSIS — Z0000 Encounter for general adult medical examination without abnormal findings: Secondary | ICD-10-CM | POA: Diagnosis not present

## 2022-08-08 DIAGNOSIS — R5381 Other malaise: Secondary | ICD-10-CM | POA: Diagnosis not present

## 2022-08-08 DIAGNOSIS — I8393 Asymptomatic varicose veins of bilateral lower extremities: Secondary | ICD-10-CM | POA: Diagnosis not present

## 2022-08-08 DIAGNOSIS — R0683 Snoring: Secondary | ICD-10-CM | POA: Diagnosis not present

## 2022-08-08 DIAGNOSIS — Z85820 Personal history of malignant melanoma of skin: Secondary | ICD-10-CM | POA: Diagnosis not present

## 2022-08-08 DIAGNOSIS — Z86018 Personal history of other benign neoplasm: Secondary | ICD-10-CM | POA: Diagnosis not present

## 2022-08-08 DIAGNOSIS — Z13228 Encounter for screening for other metabolic disorders: Secondary | ICD-10-CM | POA: Diagnosis not present

## 2022-08-08 DIAGNOSIS — Z85828 Personal history of other malignant neoplasm of skin: Secondary | ICD-10-CM | POA: Diagnosis not present

## 2022-08-29 ENCOUNTER — Ambulatory Visit (INDEPENDENT_AMBULATORY_CARE_PROVIDER_SITE_OTHER): Payer: BC Managed Care – PPO | Admitting: Adult Health

## 2022-08-29 ENCOUNTER — Encounter: Payer: Self-pay | Admitting: Adult Health

## 2022-08-29 VITALS — BP 110/70 | HR 81 | Temp 97.9°F | Ht 67.0 in | Wt 181.0 lb

## 2022-08-29 DIAGNOSIS — R0683 Snoring: Secondary | ICD-10-CM

## 2022-08-29 HISTORY — DX: Snoring: R06.83
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
## 2022-08-29 NOTE — Assessment & Plan Note (Addendum)
Snoring, restless sleep, daytime sleepiness all concerning for underlying sleep apnea.  Patient has significant symptom burden.  Discussed healthy sleep regimen.  Helpful hands for insomnia.   Set patient up for home sleep study.  If study results are negative consider referral for therapy for insomnia and referral to orthodontia for consideration of oral appliance. - discussed how weight can impact sleep and risk for sleep disordered breathing - discussed options to assist with weight loss: combination of diet modification, cardiovascular and strength training exercises   - had an extensive discussion regarding the adverse health consequences related to untreated sleep disordered breathing - specifically discussed the risks for hypertension, coronary artery disease, cardiac dysrhythmias, cerebrovascular disease, and diabetes - lifestyle modification discussed   - discussed how sleep disruption can increase risk of accidents, particularly when driving - safe driving practices were discussed   Plan  Patient Instructions  Set up home sleep study  Work on healthy weight loss  Healthy sleep regimen  Follow up in 2 months to discuss sleep study results and treatment results

## 2022-08-29 NOTE — Patient Instructions (Signed)
Set up home sleep study  Work on healthy weight loss  Healthy sleep regimen  Follow up in 2 months to discuss sleep study results and treatment results

## 2022-08-30 NOTE — Progress Notes (Signed)
Reviewed and agree with assessment/plan.   Chesley Mires, MD [facility] Pulmonary/Critical Care 08/30/2022, 9:17 AM Pager:  [phone]

## 2022-09-04 DIAGNOSIS — Z1231 Encounter for screening mammogram for malignant neoplasm of breast: Secondary | ICD-10-CM | POA: Diagnosis not present

## 2022-09-06 DIAGNOSIS — R921 Mammographic calcification found on diagnostic imaging of breast: Secondary | ICD-10-CM | POA: Insufficient documentation

## 2022-09-06 HISTORY — DX: Mammographic calcification found on diagnostic imaging of breast: R92.1

## 2022-09-19 DIAGNOSIS — R928 Other abnormal and inconclusive findings on diagnostic imaging of breast: Secondary | ICD-10-CM | POA: Diagnosis not present

## 2022-09-19 DIAGNOSIS — R921 Mammographic calcification found on diagnostic imaging of breast: Secondary | ICD-10-CM | POA: Diagnosis not present

## 2022-11-24 ENCOUNTER — Ambulatory Visit: Payer: BC Managed Care – PPO

## 2022-11-24 DIAGNOSIS — R0683 Snoring: Secondary | ICD-10-CM

## 2022-11-28 DIAGNOSIS — R0683 Snoring: Secondary | ICD-10-CM

## 2022-12-15 ENCOUNTER — Encounter: Payer: Self-pay | Admitting: Adult Health

## 2022-12-15 ENCOUNTER — Ambulatory Visit (INDEPENDENT_AMBULATORY_CARE_PROVIDER_SITE_OTHER): Payer: BC Managed Care – PPO | Admitting: Adult Health

## 2022-12-15 VITALS — BP 110/86 | HR 77 | Temp 98.3°F | Ht 67.0 in | Wt 175.8 lb

## 2022-12-15 DIAGNOSIS — N951 Menopausal and female climacteric states: Secondary | ICD-10-CM | POA: Diagnosis not present

## 2022-12-15 DIAGNOSIS — G4733 Obstructive sleep apnea (adult) (pediatric): Secondary | ICD-10-CM

## 2022-12-15 DIAGNOSIS — R0683 Snoring: Secondary | ICD-10-CM | POA: Diagnosis not present

## 2022-12-15 DIAGNOSIS — M85851 Other specified disorders of bone density and structure, right thigh: Secondary | ICD-10-CM | POA: Diagnosis not present

## 2022-12-15 DIAGNOSIS — M8589 Other specified disorders of bone density and structure, multiple sites: Secondary | ICD-10-CM | POA: Diagnosis not present

## 2022-12-15 NOTE — Progress Notes (Signed)
Reviewed and agree with assessment/plan.   Chesley Mires, MD [facility] Pulmonary/Critical Care 12/15/2022, 9:31 AM Pager:  [phone]

## 2022-12-15 NOTE — Assessment & Plan Note (Signed)
Chronic snoring with disrupted sleep-repeat home sleep study showed no significant sleep apnea AHI was 1.8/hour.  We discussed her sleep study results in detail.  We discussed treatment options including positional sleep.  Weight loss.  An oral appliance.  Referral in for Dr. Toy Cookey with orthodontics for evaluation for oral appliance.  Plan  Patient Instructions  Positional sleep, avoid sleeping on back . Incline if possible  Referral to Dr. Toy Cookey orthodontics for oral appliance for snoring  [phone]) Work on healthy weight .  Healthy sleep regimen  Follow up as needed.

## 2022-12-15 NOTE — Patient Instructions (Addendum)
Positional sleep, avoid sleeping on back . Incline if possible  Referral to Dr. Toy Cookey orthodontics for oral appliance for snoring  [phone]) Work on healthy weight .  Healthy sleep regimen  Follow up as needed.

## 2022-12-15 NOTE — Progress Notes (Signed)
Has had [place] covid vaccines x3.
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
## 2022-12-15 NOTE — Addendum Note (Signed)
Addended by: Vanessa Barbara on: 12/15/2022 09:45 AM   Modules accepted: Orders

## 2022-12-27 DIAGNOSIS — Z79899 Other long term (current) drug therapy: Secondary | ICD-10-CM | POA: Diagnosis not present

## 2022-12-27 DIAGNOSIS — R110 Nausea: Secondary | ICD-10-CM | POA: Diagnosis not present

## 2022-12-27 DIAGNOSIS — R202 Paresthesia of skin: Secondary | ICD-10-CM | POA: Diagnosis not present

## 2022-12-27 DIAGNOSIS — R29810 Facial weakness: Secondary | ICD-10-CM | POA: Diagnosis not present

## 2022-12-27 DIAGNOSIS — R519 Headache, unspecified: Secondary | ICD-10-CM | POA: Diagnosis not present

## 2023-01-09 DIAGNOSIS — Z9889 Other specified postprocedural states: Secondary | ICD-10-CM | POA: Diagnosis not present

## 2023-01-09 DIAGNOSIS — R202 Paresthesia of skin: Secondary | ICD-10-CM | POA: Diagnosis not present

## 2023-01-09 DIAGNOSIS — R519 Headache, unspecified: Secondary | ICD-10-CM | POA: Diagnosis not present

## 2023-01-09 DIAGNOSIS — Z8669 Personal history of other diseases of the nervous system and sense organs: Secondary | ICD-10-CM | POA: Diagnosis not present

## 2023-01-10 DIAGNOSIS — D329 Benign neoplasm of meninges, unspecified: Secondary | ICD-10-CM | POA: Diagnosis not present

## 2023-01-11 DIAGNOSIS — D2262 Melanocytic nevi of left upper limb, including shoulder: Secondary | ICD-10-CM | POA: Diagnosis not present

## 2023-01-11 DIAGNOSIS — D2271 Melanocytic nevi of right lower limb, including hip: Secondary | ICD-10-CM | POA: Diagnosis not present

## 2023-01-11 DIAGNOSIS — L720 Epidermal cyst: Secondary | ICD-10-CM | POA: Diagnosis not present

## 2023-01-11 DIAGNOSIS — D2261 Melanocytic nevi of right upper limb, including shoulder: Secondary | ICD-10-CM | POA: Diagnosis not present

## 2023-05-02 DIAGNOSIS — E785 Hyperlipidemia, unspecified: Secondary | ICD-10-CM

## 2023-05-02 HISTORY — DX: Hyperlipidemia, unspecified: E78.5

## 2023-05-14 DIAGNOSIS — R931 Abnormal findings on diagnostic imaging of heart and coronary circulation: Secondary | ICD-10-CM | POA: Insufficient documentation

## 2023-05-14 HISTORY — DX: Abnormal findings on diagnostic imaging of heart and coronary circulation: R93.1

## 2023-05-21 ENCOUNTER — Encounter: Payer: Self-pay | Admitting: Cardiology

## 2023-06-14 ENCOUNTER — Encounter: Payer: Self-pay | Admitting: Cardiology

## 2023-06-14 DIAGNOSIS — C801 Malignant (primary) neoplasm, unspecified: Secondary | ICD-10-CM | POA: Insufficient documentation

## 2023-06-14 DIAGNOSIS — E7841 Elevated Lipoprotein(a): Secondary | ICD-10-CM | POA: Insufficient documentation

## 2023-06-14 DIAGNOSIS — N6009 Solitary cyst of unspecified breast: Secondary | ICD-10-CM | POA: Insufficient documentation

## 2023-06-14 DIAGNOSIS — N9089 Other specified noninflammatory disorders of vulva and perineum: Secondary | ICD-10-CM | POA: Insufficient documentation

## 2023-06-14 DIAGNOSIS — I8290 Acute embolism and thrombosis of unspecified vein: Secondary | ICD-10-CM | POA: Insufficient documentation

## 2023-06-14 DIAGNOSIS — G43909 Migraine, unspecified, not intractable, without status migrainosus: Secondary | ICD-10-CM | POA: Insufficient documentation

## 2023-06-14 DIAGNOSIS — I10 Essential (primary) hypertension: Secondary | ICD-10-CM | POA: Insufficient documentation

## 2023-06-14 DIAGNOSIS — D329 Benign neoplasm of meninges, unspecified: Secondary | ICD-10-CM | POA: Insufficient documentation
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
## 2023-06-15 ENCOUNTER — Telehealth: Payer: Self-pay | Admitting: Cardiology

## 2023-06-15 ENCOUNTER — Encounter: Payer: Self-pay | Admitting: Cardiology

## 2023-06-15 ENCOUNTER — Ambulatory Visit: Payer: BC Managed Care – PPO | Attending: Cardiology | Admitting: Cardiology

## 2023-06-15 VITALS — BP 106/62 | HR 70 | Ht 67.5 in | Wt 169.4 lb

## 2023-06-15 DIAGNOSIS — R072 Precordial pain: Secondary | ICD-10-CM

## 2023-06-15 DIAGNOSIS — R931 Abnormal findings on diagnostic imaging of heart and coronary circulation: Secondary | ICD-10-CM | POA: Diagnosis not present

## 2023-06-15 DIAGNOSIS — E7800 Pure hypercholesterolemia, unspecified: Secondary | ICD-10-CM

## 2023-06-15 DIAGNOSIS — Z8249 Family history of ischemic heart disease and other diseases of the circulatory system: Secondary | ICD-10-CM

## 2023-06-15 MED ORDER — METOPROLOL TARTRATE 100 MG PO TABS: 100.0000 mg | ORAL_TABLET | Freq: Once | ORAL | 0 refills | Status: DC

## 2023-06-15 MED ORDER — PRAVASTATIN SODIUM 20 MG PO TABS: 20.0000 mg | ORAL_TABLET | ORAL | 3 refills | Status: DC

## 2023-06-15 NOTE — Patient Instructions (Signed)
Medication Instructions:  Your physician has recommended you make the following change in your medication:   START: Pravastatin 20 mg every M - W - F  *If you need a refill on your cardiac medications before your next appointment, please call your pharmacy*   Lab Work: Your physician recommends that you return for lab work in:   Labs on Monday: BMP, Lipids, Lpa, HS CRP  If you have labs (blood work) drawn today and your tests are completely normal, you will receive your results only by: MyChart Message (if you have MyChart) OR A paper copy in the mail If you have any lab test that is abnormal or we need to change your treatment, we will call you to review the results.   Testing/Procedures:   Your cardiac CT will be scheduled at one of the below locations:   [facility] [address] [place], [place] [address] [phone]  OR  [facility] [address] [place], [place] [address] [phone]  OR   [facility] [address] [place], [place] [address] [phone]  If scheduled at [facility], please arrive at the [facility] and Children's Entrance (Entrance C2) of [facility] 30 minutes prior to test start time. You can use the FREE valet parking offered at entrance C (encouraged to control the heart rate for the test)  Proceed to the [facility] Radiology Department (first floor) to check-in and test prep.  All radiology patients and guests should use entrance C2 at [facility], accessed from [facility], even though the hospital's physical address listed is [address].    If scheduled at [facility] or [facility], please arrive 15 mins early for check-in and test prep.  There is spacious parking and easy access to the radiology department from the [facility] entrance. Please  enter here and check-in with the desk attendant.   Please follow these instructions carefully (unless otherwise directed):  An IV will be required for this test and Nitroglycerin will be given.  Hold all erectile dysfunction medications at least 3 days (72 hrs) prior to test. (Ie viagra, cialis, sildenafil, tadalafil, etc)   On the Night Before the Test: Be sure to Drink plenty of water. Do not consume any caffeinated/decaffeinated beverages or chocolate 12 hours prior to your test. Do not take any antihistamines 12 hours prior to your test.  On the Day of the Test: Drink plenty of water until 1 hour prior to the test. Do not eat any food 1 hour prior to test. You may take your regular medications prior to the test.  Take metoprolol (Lopressor) two hours prior to test. FEMALES- please wear underwire-free bra if available, avoid dresses & tight clothing      After the Test: Drink plenty of water. After receiving IV contrast, you may experience a mild flushed feeling. This is normal. On occasion, you may experience a mild rash up to 24 hours after the test. This is not dangerous. If this occurs, you can take Benadryl 25 mg and increase your fluid intake. If you experience trouble breathing, this can be serious. If it is severe call 911 IMMEDIATELY. If it is mild, please call our office.  We will call to schedule your test 2-4 weeks out understanding that some insurance companies will need an authorization prior to the service being performed.   For more information  and frequently asked questions, please visit our website : [email or web address]  For non-scheduling related questions, please contact the cardiac imaging nurse navigator should you have any questions/concerns: Cardiac Imaging Nurse Navigators Direct Office Dial: [phone]   For scheduling needs, including cancellations and rescheduling, please call [place], [phone].    Follow-Up: At [facility], you and your health needs are our priority.  As part of our continuing mission to provide you with exceptional heart care, we have created designated Provider Care Teams.  These Care Teams include your primary Cardiologist (physician) and Advanced Practice Providers (APPs -  Physician Assistants and Nurse Practitioners) who all work together to provide you with the care you need, when you need it.  We recommend signing up for the patient portal called "MyChart".  Sign up information is provided on this After Visit Summary.  MyChart is used to connect with patients for Virtual Visits (Telemedicine).  Patients are able to view lab/test results, encounter notes, upcoming appointments, etc.  Non-urgent messages can be sent to your provider as well.   To learn more about what you can do with MyChart, go to [email or web address].    Your next appointment:   6 week(s)  Provider:   Norman Herrlich, MD    Other Instructions None

## 2023-06-15 NOTE — Telephone Encounter (Signed)
Called patient and informed her that the Metoprolol tartrate 100 mg was the 1 pill that she is suppose to take 2 hours before her Cardiac CT. Patient verbalized understanding and had no further questions.

## 2023-06-15 NOTE — Telephone Encounter (Signed)
Pt c/o medication issue:  1. Name of Medication:   metoprolol tartrate (LOPRESSOR) 100 MG tablet   2. How are you currently taking this medication (dosage and times per day)? Not started yet  3. Are you having a reaction (difficulty breathing--STAT)?   4. What is your medication issue?   Patient stated she was not aware she was to start this medication until she went to her pharmacy.  Patient stated she does not have high blood pressure.

## 2023-06-21 ENCOUNTER — Ambulatory Visit (HOSPITAL_COMMUNITY): Payer: BC Managed Care – PPO

## 2023-06-26 ENCOUNTER — Ambulatory Visit (HOSPITAL_COMMUNITY)
Admission: RE | Admit: 2023-06-26 | Discharge: 2023-06-26 | Disposition: A | Discharge status: Home / Self Care | Payer: BC Managed Care – PPO | Source: Ambulatory Visit | Attending: Cardiology | Admitting: Cardiology

## 2023-06-26 ENCOUNTER — Telehealth: Payer: Self-pay

## 2023-06-26 DIAGNOSIS — R072 Precordial pain: Secondary | ICD-10-CM | POA: Diagnosis present

## 2023-06-26 MED ORDER — NITROGLYCERIN 0.4 MG SL SUBL
SUBLINGUAL_TABLET | SUBLINGUAL | Status: AC
  Filled 2023-06-26: qty 2

## 2023-06-26 MED ORDER — IOHEXOL 350 MG/ML SOLN
100.0000 mL | Freq: Once | INTRAVENOUS | Status: AC | PRN
  Administered 2023-06-26: 100 mL via INTRAVENOUS

## 2023-06-26 MED ORDER — REPATHA 140 MG/ML ~~LOC~~ SOSY: 140.0000 mg | PREFILLED_SYRINGE | SUBCUTANEOUS | 6 refills | Status: DC

## 2023-06-26 MED ORDER — NITROGLYCERIN 0.4 MG SL SUBL
0.8000 mg | SUBLINGUAL_TABLET | Freq: Once | SUBLINGUAL | Status: AC
  Administered 2023-06-26: 0.8 mg via SUBLINGUAL

## 2023-06-26 NOTE — Telephone Encounter (Signed)
-----   Message from Nurse Ladonna Snide F sent at 06/21/2023 11:26 AM EDT -----  ----- Message ----- From: Baldo Daub, MD Sent: 06/20/2023   9:51 AM EDT To: [facility]p Triage  Probably the best indicator is non-HDL cholesterol which is elevated in her case although the LDL is only modestly elevated she does have a genetic abnormality with elevated LP(a) a potent risk enhancer ideally best treated with PCSK9 inhibitor lets see if her insurance will go her use Repatha 140 mg every 2 weeks and if they allow that she can stop her statin. The inflammatory marker was normal.

## 2023-06-26 NOTE — Telephone Encounter (Signed)
Please do a PA on Repatha.

## 2023-06-27 ENCOUNTER — Other Ambulatory Visit (HOSPITAL_COMMUNITY): Payer: Self-pay

## 2023-06-27 NOTE — Telephone Encounter (Signed)
  Received notification from Physician's Office that prior authorization for REPATHA is required/requested.   Per test claim: The current 30 day co-pay is, $0.  No PA needed at this time. This test claim was processed through [facility]- copay amounts may vary at other pharmacies due to pharmacy/plan contracts, or as the patient moves through the different stages of their insurance plan.

## 2023-07-02 ENCOUNTER — Ambulatory Visit: Payer: BC Managed Care – PPO | Attending: Cardiology

## 2023-07-02 VITALS — BP 120/66 | HR 72 | Ht 68.0 in | Wt 167.2 lb

## 2023-07-02 DIAGNOSIS — E7800 Pure hypercholesterolemia, unspecified: Secondary | ICD-10-CM

## 2023-07-03 MED ORDER — EVOLOCUMAB 140 MG/ML ~~LOC~~ SOSY
140.0000 mg | PREFILLED_SYRINGE | Freq: Once | SUBCUTANEOUS | Status: AC
Start: 2023-07-03 — End: ?

## 2023-07-30 ENCOUNTER — Encounter: Payer: Self-pay | Admitting: Cardiology
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
## 2023-07-31 ENCOUNTER — Encounter: Payer: Self-pay | Admitting: Cardiology

## 2023-07-31 ENCOUNTER — Other Ambulatory Visit: Payer: Self-pay

## 2023-07-31 ENCOUNTER — Ambulatory Visit: Payer: BC Managed Care – PPO | Attending: Cardiology | Admitting: Cardiology

## 2023-07-31 VITALS — BP 106/64 | HR 65 | Ht 68.0 in | Wt 166.4 lb

## 2023-07-31 DIAGNOSIS — E7800 Pure hypercholesterolemia, unspecified: Secondary | ICD-10-CM

## 2023-07-31 DIAGNOSIS — I2510 Atherosclerotic heart disease of native coronary artery without angina pectoris: Secondary | ICD-10-CM

## 2023-07-31 DIAGNOSIS — E7841 Elevated Lipoprotein(a): Secondary | ICD-10-CM

## 2023-07-31 DIAGNOSIS — R931 Abnormal findings on diagnostic imaging of heart and coronary circulation: Secondary | ICD-10-CM

## 2023-07-31 NOTE — Patient Instructions (Signed)
Medication Instructions:  Your physician recommends that you continue on your current medications as directed. Please refer to the Current Medication list given to you today.  *If you need a refill on your cardiac medications before your next appointment, please call your pharmacy*   Lab Work: None If you have labs (blood work) drawn today and your tests are completely normal, you will receive your results only by: MyChart Message (if you have MyChart) OR A paper copy in the mail If you have any lab test that is abnormal or we need to change your treatment, we will call you to review the results.   Testing/Procedures: None   Follow-Up: At [facility], you and your health needs are our priority.  As part of our continuing mission to provide you with exceptional heart care, we have created designated Provider Care Teams.  These Care Teams include your primary Cardiologist (physician) and Advanced Practice Providers (APPs -  Physician Assistants and Nurse Practitioners) who all work together to provide you with the care you need, when you need it.  We recommend signing up for the patient portal called "MyChart".  Sign up information is provided on this After Visit Summary.  MyChart is used to connect with patients for Virtual Visits (Telemedicine).  Patients are able to view lab/test results, encounter notes, upcoming appointments, etc.  Non-urgent messages can be sent to your provider as well.   To learn more about what you can do with MyChart, go to [email or web address].    Your next appointment:   6 month(s)  Provider:   Norman Herrlich, MD    Other Instructions None

## 2023-08-01 ENCOUNTER — Encounter: Payer: Self-pay | Admitting: Cardiology

## 2023-08-08 DIAGNOSIS — I2510 Atherosclerotic heart disease of native coronary artery without angina pectoris: Secondary | ICD-10-CM | POA: Insufficient documentation

## 2023-08-12 ENCOUNTER — Encounter: Payer: Self-pay | Admitting: Cardiology

## 2023-09-19 ENCOUNTER — Other Ambulatory Visit: Payer: Self-pay | Admitting: Specialist

## 2023-09-19 DIAGNOSIS — Z1231 Encounter for screening mammogram for malignant neoplasm of breast: Secondary | ICD-10-CM

## 2023-09-20 ENCOUNTER — Ambulatory Visit
Admission: RE | Admit: 2023-09-20 | Discharge: 2023-09-20 | Disposition: A | Discharge status: Home / Self Care | Payer: BC Managed Care – PPO | Source: Ambulatory Visit | Attending: Specialist | Admitting: Specialist

## 2023-09-20 DIAGNOSIS — Z1231 Encounter for screening mammogram for malignant neoplasm of breast: Secondary | ICD-10-CM

## 2024-01-02 ENCOUNTER — Other Ambulatory Visit: Payer: Self-pay | Admitting: Cardiology

## 2024-01-02 NOTE — Telephone Encounter (Signed)
 Prescription sent to pharmacy.
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
## 2024-01-24 ENCOUNTER — Encounter: Payer: Self-pay | Admitting: Cardiology

## 2024-01-24 ENCOUNTER — Ambulatory Visit: Attending: Cardiology | Admitting: Cardiology

## 2024-01-24 VITALS — BP 100/70 | HR 67 | Ht 68.0 in | Wt 165.0 lb

## 2024-01-24 DIAGNOSIS — R931 Abnormal findings on diagnostic imaging of heart and coronary circulation: Secondary | ICD-10-CM

## 2024-01-24 DIAGNOSIS — E7841 Elevated Lipoprotein(a): Secondary | ICD-10-CM | POA: Diagnosis not present

## 2024-01-24 DIAGNOSIS — R42 Dizziness and giddiness: Secondary | ICD-10-CM

## 2024-01-24 DIAGNOSIS — I2510 Atherosclerotic heart disease of native coronary artery without angina pectoris: Secondary | ICD-10-CM | POA: Diagnosis not present

## 2024-01-24 DIAGNOSIS — E7800 Pure hypercholesterolemia, unspecified: Secondary | ICD-10-CM | POA: Diagnosis not present

## 2024-01-24 NOTE — Patient Instructions (Signed)
 Medication Instructions:  Your physician recommends that you continue on your current medications as directed. Please refer to the Current Medication list given to you today.  *If you need a refill on your cardiac medications before your next appointment, please call your pharmacy*   Lab Work: Your physician recommends that you return for lab work in:   Labs to be done in August: Lipids, Lpa, Apo B  If you have labs (blood work) drawn today and your tests are completely normal, you will receive your results only by: MyChart Message (if you have MyChart) OR A paper copy in the mail If you have any lab test that is abnormal or we need to change your treatment, we will call you to review the results.   Testing/Procedures: None   Follow-Up: At [facility], you and your health needs are our priority.  As part of our continuing mission to provide you with exceptional heart care, we have created designated Provider Care Teams.  These Care Teams include your primary Cardiologist (physician) and Advanced Practice Providers (APPs -  Physician Assistants and Nurse Practitioners) who all work together to provide you with the care you need, when you need it.  We recommend signing up for the patient portal called "MyChart".  Sign up information is provided on this After Visit Summary.  MyChart is used to connect with patients for Virtual Visits (Telemedicine).  Patients are able to view lab/test results, encounter notes, upcoming appointments, etc.  Non-urgent messages can be sent to your provider as well.   To learn more about what you can do with MyChart, go to [email or web address].    Your next appointment:   1 year(s)  Provider:   Norman Herrlich, MD    Other Instructions None

## 2024-01-28 ENCOUNTER — Encounter: Payer: Self-pay | Admitting: Cardiology

## 2024-02-13 ENCOUNTER — Other Ambulatory Visit: Payer: Self-pay

## 2024-02-13 ENCOUNTER — Ambulatory Visit: Payer: BC Managed Care – PPO | Admitting: Cardiology

## 2024-02-13 ENCOUNTER — Other Ambulatory Visit (HOSPITAL_BASED_OUTPATIENT_CLINIC_OR_DEPARTMENT_OTHER): Payer: Self-pay

## 2024-02-13 ENCOUNTER — Encounter (HOSPITAL_BASED_OUTPATIENT_CLINIC_OR_DEPARTMENT_OTHER): Payer: Self-pay | Admitting: Emergency Medicine

## 2024-02-13 ENCOUNTER — Ambulatory Visit (HOSPITAL_BASED_OUTPATIENT_CLINIC_OR_DEPARTMENT_OTHER)
Admission: EM | Admit: 2024-02-13 | Discharge: 2024-02-13 | Disposition: A | Discharge status: Home / Self Care | Attending: Nurse Practitioner | Admitting: Nurse Practitioner

## 2024-02-13 DIAGNOSIS — J302 Other seasonal allergic rhinitis: Secondary | ICD-10-CM | POA: Diagnosis not present

## 2024-02-13 DIAGNOSIS — R051 Acute cough: Secondary | ICD-10-CM | POA: Diagnosis not present

## 2024-02-13 MED ORDER — ALBUTEROL SULFATE HFA 108 (90 BASE) MCG/ACT IN AERS
2.0000 | INHALATION_SPRAY | RESPIRATORY_TRACT | 0 refills | Status: DC | PRN
  Filled 2024-02-13: qty 6.7, 16d supply, fill #0

## 2024-02-13 MED ORDER — PROMETHAZINE-DM 6.25-15 MG/5ML PO SYRP
5.0000 mL | ORAL_SOLUTION | Freq: Four times a day (QID) | ORAL | 0 refills | Status: DC | PRN
  Filled 2024-02-13: qty 118, 6d supply, fill #0

## 2024-02-13 NOTE — ED Triage Notes (Signed)
 Pt reports persistent cough, fatigue and sore throat since Saturday.
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
## 2024-02-13 NOTE — Discharge Instructions (Addendum)
 As discussed, I feel that your symptoms are combination of allergies to pollen and a vaccine response after getting your COVID shot.  May use OTC antihistamine eyedrops, Zyrtec, and Flonase.  A prescription for a cough syrup and albuterol inhaler has been sent to the pharmacy to further assist you with your symptoms.  Monitor closely for any new or worsening concerns.  Ensure adequate rest and oral hydration.

## 2024-02-15 ENCOUNTER — Encounter (HOSPITAL_BASED_OUTPATIENT_CLINIC_OR_DEPARTMENT_OTHER): Payer: Self-pay

## 2024-02-15 ENCOUNTER — Other Ambulatory Visit (HOSPITAL_BASED_OUTPATIENT_CLINIC_OR_DEPARTMENT_OTHER): Payer: Self-pay

## 2024-02-15 ENCOUNTER — Ambulatory Visit (HOSPITAL_BASED_OUTPATIENT_CLINIC_OR_DEPARTMENT_OTHER)
Admission: RE | Admit: 2024-02-15 | Discharge: 2024-02-15 | Disposition: A | Discharge status: Home / Self Care | Source: Ambulatory Visit | Attending: Nurse Practitioner | Admitting: Nurse Practitioner

## 2024-02-15 VITALS — BP 135/84 | HR 79 | Temp 98.2°F | Resp 18

## 2024-02-15 DIAGNOSIS — J069 Acute upper respiratory infection, unspecified: Secondary | ICD-10-CM

## 2024-02-15 MED ORDER — AZITHROMYCIN 500 MG PO TABS
500.0000 mg | ORAL_TABLET | Freq: Every day | ORAL | 0 refills | Status: AC
  Filled 2024-02-15: qty 5, 5d supply, fill #0

## 2024-02-15 MED ORDER — PREDNISONE 20 MG PO TABS
40.0000 mg | ORAL_TABLET | Freq: Every day | ORAL | 0 refills | Status: AC
Start: 2024-02-15 — End: 2024-02-20
  Filled 2024-02-15: qty 10, 5d supply, fill #0

## 2024-02-15 MED ORDER — DEXAMETHASONE SODIUM PHOSPHATE 10 MG/ML IJ SOLN
10.0000 mg | Freq: Once | INTRAMUSCULAR | Status: AC
  Administered 2024-02-15: 10 mg via INTRAMUSCULAR

## 2024-02-15 MED ORDER — GUAIFENESIN-CODEINE 100-10 MG/5ML PO SOLN
10.0000 mL | ORAL | 0 refills | Status: DC | PRN
Start: 2024-02-15 — End: 2024-05-14

## 2024-02-15 MED ORDER — GUAIFENESIN-CODEINE 100-10 MG/5ML PO SOLN
10.0000 mL | ORAL | 0 refills | Status: DC | PRN
  Filled 2024-02-15: qty 120, 2d supply, fill #0

## 2024-02-15 MED ORDER — PSEUDOEPH-BROMPHEN-DM 30-2-10 MG/5ML PO SYRP
10.0000 mL | ORAL_SOLUTION | Freq: Four times a day (QID) | ORAL | 0 refills | Status: DC | PRN
  Filled 2024-02-15: qty 120, 3d supply, fill #0
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
## 2024-02-15 NOTE — Discharge Instructions (Addendum)
 You have been seen today for worsening respiratory symptoms, which are likely due to an upper respiratory infection. This type of infection can be caused by either a virus or bacteria. You received a steroid injection today in the clinic to help reduce inflammation. Be sure to take the antibiotics and steroids as prescribed. Start the steroid in the morning to avoid it affecting your sleep tonight.  Drink plenty of fluids to help thin the mucus in your lungs, making it easier to cough up. Use your previously prescribed inhaler as needed to help improve your breathing. Using a vaporizer or humidifier in your bedroom at night can also make it easier to breathe.  Stop using the cough medicine that was prescribed before and start taking the new one given to you today. This medication will help thin mucus, clear congestion, and reduce coughing. You can also take two teaspoons of honey at bedtime to help with nighttime coughing.  Using Flonase daily can also help with your symptoms. When using it, slowly sniff in the spray. You should not be able to taste the medicine. If you do taste it or feel it go down your throat, you are using it incorrectly and it will not work as well.  Get plenty of rest while you recover. Once you finish your antibiotics, make sure to replace your toothbrush.

## 2024-02-15 NOTE — ED Triage Notes (Signed)
 Pt returning not feeling any better was seen on 04/02, pt c/o cough, sore throat, fatigue

## 2024-05-08 ENCOUNTER — Encounter: Payer: Self-pay | Admitting: Cardiology

## 2024-05-14 ENCOUNTER — Ambulatory Visit

## 2024-05-14 VITALS — BP 110/70 | HR 67 | Ht 68.0 in | Wt 172.8 lb

## 2024-05-14 DIAGNOSIS — R0789 Other chest pain: Secondary | ICD-10-CM | POA: Diagnosis not present

## 2024-05-14 DIAGNOSIS — E7800 Pure hypercholesterolemia, unspecified: Secondary | ICD-10-CM

## 2024-05-14 DIAGNOSIS — I2510 Atherosclerotic heart disease of native coronary artery without angina pectoris: Secondary | ICD-10-CM

## 2024-05-14 MED ORDER — ASPIRIN 81 MG PO TBEC: 81.0000 mg | DELAYED_RELEASE_TABLET | Freq: Every day | ORAL | 3 refills | Status: DC

## 2024-05-14 NOTE — Assessment & Plan Note (Addendum)
 Atypical chest pain, does not appear cardiac in nature. She does have cardiovascular risk factors.  Objective assessment with treadmill EKG stress test was recommended if she is having persistent symptoms, however at this time she feels symptoms could have been stress related and since not having any exertional symptoms we will hold off for now.  But if she does have any recurrent symptoms or limiting her activities she is agreeable to pursuing this.  She has visit pending with PCP and will further discuss management of stress and anxiety with them.

## 2024-05-14 NOTE — Assessment & Plan Note (Signed)
 Last lipid panel from September 2024 total was 132, triglycerides 94, HDL 68 and LDL 47.  Continue Repatha  140 mg subcutaneous every 2 weeks.

## 2024-05-14 NOTE — Patient Instructions (Signed)
 Medication Instructions:  Your physician has recommended you make the following change in your medication:   Start taking 81 mg coated aspirin daily  *If you need a refill on your cardiac medications before your next appointment, please call your pharmacy*   Lab Work: None ordered If you have labs (blood work) drawn today and your tests are completely normal, you will receive your results only by: MyChart Message (if you have MyChart) OR A paper copy in the mail If you have any lab test that is abnormal or we need to change your treatment, we will call you to review the results.   Testing/Procedures: None ordered   Follow-Up: At [facility], you and your health needs are our priority.  As part of our continuing mission to provide you with exceptional heart care, we have created designated Provider Care Teams.  These Care Teams include your primary Cardiologist (physician) and Advanced Practice Providers (APPs -  Physician Assistants and Nurse Practitioners) who all work together to provide you with the care you need, when you need it.  We recommend signing up for the patient portal called MyChart.  Sign up information is provided on this After Visit Summary.  MyChart is used to connect with patients for Virtual Visits (Telemedicine).  Patients are able to view lab/test results, encounter notes, upcoming appointments, etc.  Non-urgent messages can be sent to your provider as well.   To learn more about what you can do with MyChart, go to [email or web address].    Your next appointment:   4-5 month    The format for your next appointment:   In Person  Provider:   Redell Leiter, MD    Other Instructions none  Important Information About Sugar

## 2024-05-14 NOTE — Assessment & Plan Note (Signed)
 Given her coronary atherosclerosis noted on cardiac CTA August 2024, recommended to be on low-dose aspirin 81 mg once daily.,  She is agreeable. Discussed potential risk benefits with aspirin. Did not tolerate statins due to fatigue but only took it for a brief time.   Lipid management is optimal on current regimen with Repatha .
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
## 2024-06-16 ENCOUNTER — Other Ambulatory Visit: Payer: Self-pay | Admitting: Cardiology

## 2024-08-14 ENCOUNTER — Other Ambulatory Visit: Payer: Self-pay | Admitting: Specialist

## 2024-08-14 DIAGNOSIS — Z0000 Encounter for general adult medical examination without abnormal findings: Secondary | ICD-10-CM
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
## 2024-09-17 ENCOUNTER — Ambulatory Visit: Attending: Cardiology | Admitting: Cardiology

## 2024-09-17 ENCOUNTER — Encounter: Payer: Self-pay | Admitting: Cardiology

## 2024-09-17 VITALS — BP 108/62 | HR 76 | Ht 68.0 in | Wt 178.4 lb

## 2024-09-17 DIAGNOSIS — I2510 Atherosclerotic heart disease of native coronary artery without angina pectoris: Secondary | ICD-10-CM

## 2024-09-17 DIAGNOSIS — R931 Abnormal findings on diagnostic imaging of heart and coronary circulation: Secondary | ICD-10-CM | POA: Diagnosis not present

## 2024-09-17 DIAGNOSIS — E7841 Elevated Lipoprotein(a): Secondary | ICD-10-CM

## 2024-09-17 NOTE — Patient Instructions (Signed)
 Medication Instructions:  Your physician recommends that you continue on your current medications as directed. Please refer to the Current Medication list given to you today.  *If you need a refill on your cardiac medications before your next appointment, please call your pharmacy*   Lab Work: Your physician recommends that you return for lab work in: after 10/14/24 for Lp(a) and ApoB You need to have labs done when you are fasting.  You can come Monday through Friday 8:30 am to 12:00 pm and 1:15 to 4:30. You do not need to make an appointment as the order has already been placed.   If you have labs (blood work) drawn today and your tests are completely normal, you will receive your results only by: MyChart Message (if you have MyChart) OR A paper copy in the mail If you have any lab test that is abnormal or we need to change your treatment, we will call you to review the results.   Testing/Procedures: None ordered   Follow-Up: At [facility], you and your health needs are our priority.  As part of our continuing mission to provide you with exceptional heart care, we have created designated Provider Care Teams.  These Care Teams include your primary Cardiologist (physician) and Advanced Practice Providers (APPs -  Physician Assistants and Nurse Practitioners) who all work together to provide you with the care you need, when you need it.  We recommend signing up for the patient portal called MyChart.  Sign up information is provided on this After Visit Summary.  MyChart is used to connect with patients for Virtual Visits (Telemedicine).  Patients are able to view lab/test results, encounter notes, upcoming appointments, etc.  Non-urgent messages can be sent to your provider as well.   To learn more about what you can do with MyChart, go to [email or web address].    Your next appointment:   12 month(s)  The format for your next appointment:   In Person  Provider:   Redell Leiter, MD    Other Instructions none  Important Information About Sugar

## 2024-09-23 ENCOUNTER — Ambulatory Visit
Admission: RE | Admit: 2024-09-23 | Discharge: 2024-09-23 | Disposition: A | Source: Ambulatory Visit | Attending: Specialist | Admitting: Specialist

## 2024-09-23 DIAGNOSIS — Z0000 Encounter for general adult medical examination without abnormal findings: Secondary | ICD-10-CM

## 2024-10-17 LAB — LIPOPROTEIN A (LPA): Lipoprotein (a): 127.4 nmol/L — ABNORMAL HIGH (ref <75.0)

## 2024-10-17 LAB — APOLIPOPROTEIN B: Apolipoprotein B: 56 mg/dL (ref <90)

## 2024-10-19 ENCOUNTER — Encounter: Payer: Self-pay | Admitting: Cardiology
